# Patient Record
Sex: Female | Born: 1980 | Hispanic: No | Marital: Married | Smoking: Never smoker
Health system: Southern US, Community
[De-identification: ages and names within clinical notes are randomized; demographics above are authoritative.]

## PROBLEM LIST (undated history)

## (undated) ENCOUNTER — Inpatient Hospital Stay (HOSPITAL_COMMUNITY): Payer: Self-pay

## (undated) DIAGNOSIS — E119 Type 2 diabetes mellitus without complications: Secondary | ICD-10-CM

## (undated) HISTORY — DX: Type 2 diabetes mellitus without complications: E11.9

---

## 2014-11-25 LAB — OB RESULTS CONSOLE HGB/HCT, BLOOD
HCT: 38 %
HEMOGLOBIN: 12.9 g/dL

## 2014-11-25 LAB — OB RESULTS CONSOLE RUBELLA ANTIBODY, IGM: Rubella: IMMUNE

## 2014-11-25 LAB — OB RESULTS CONSOLE HIV ANTIBODY (ROUTINE TESTING): HIV: NONREACTIVE

## 2014-11-25 LAB — OB RESULTS CONSOLE HEPATITIS B SURFACE ANTIGEN: Hepatitis B Surface Ag: NEGATIVE

## 2014-11-25 LAB — OB RESULTS CONSOLE PLATELET COUNT: PLATELETS: 291 10*3/uL

## 2014-11-25 LAB — OB RESULTS CONSOLE ANTIBODY SCREEN: Antibody Screen: NEGATIVE

## 2014-11-25 LAB — OB RESULTS CONSOLE RPR: RPR: NONREACTIVE

## 2014-11-25 LAB — OB RESULTS CONSOLE VARICELLA ZOSTER ANTIBODY, IGG: VARICELLA IGG: IMMUNE

## 2014-11-25 LAB — OB RESULTS CONSOLE ABO/RH: RH Type: POSITIVE

## 2015-02-17 LAB — GLUCOSE TOLERANCE, 3 HOURS
Glucose, GTT - 1 Hour: 217 mg/dL — AB (ref ?–200)
Glucose, GTT - 2 Hour: 171 mg/dL — AB (ref ?–140)
Glucose, GTT - 3 Hour: 131 mg/dL (ref ?–140)
Glucose, GTT - Fasting: 88 mg/dL (ref 80–110)

## 2015-02-25 ENCOUNTER — Encounter: Payer: Self-pay | Admitting: *Deleted

## 2015-03-04 ENCOUNTER — Encounter: Payer: Self-pay | Admitting: *Deleted

## 2015-03-16 ENCOUNTER — Ambulatory Visit (INDEPENDENT_AMBULATORY_CARE_PROVIDER_SITE_OTHER): Payer: 59 | Admitting: Obstetrics and Gynecology

## 2015-03-16 ENCOUNTER — Encounter: Payer: Self-pay | Admitting: Obstetrics and Gynecology

## 2015-03-16 VITALS — BP 115/65 | HR 74 | Temp 98.1°F | Ht 62.5 in | Wt 145.7 lb

## 2015-03-16 DIAGNOSIS — O24419 Gestational diabetes mellitus in pregnancy, unspecified control: Secondary | ICD-10-CM

## 2015-03-16 DIAGNOSIS — O34219 Maternal care for unspecified type scar from previous cesarean delivery: Secondary | ICD-10-CM

## 2015-03-16 DIAGNOSIS — O0990 Supervision of high risk pregnancy, unspecified, unspecified trimester: Secondary | ICD-10-CM | POA: Insufficient documentation

## 2015-03-16 DIAGNOSIS — O0993 Supervision of high risk pregnancy, unspecified, third trimester: Secondary | ICD-10-CM

## 2015-03-16 DIAGNOSIS — O3421 Maternal care for scar from previous cesarean delivery: Secondary | ICD-10-CM

## 2015-03-16 LAB — POCT URINALYSIS DIP (DEVICE)
Bilirubin Urine: NEGATIVE
Glucose, UA: NEGATIVE mg/dL
KETONES UR: 15 mg/dL — AB
Leukocytes, UA: NEGATIVE
Nitrite: NEGATIVE
Protein, ur: NEGATIVE mg/dL
SPECIFIC GRAVITY, URINE: 1.01 (ref 1.005–1.030)
Urobilinogen, UA: 0.2 mg/dL (ref 0.0–1.0)
pH: 6 (ref 5.0–8.0)

## 2015-03-16 MED ORDER — ACCU-CHEK NANO SMARTVIEW W/DEVICE KIT: 1.0000 | PACK | Freq: Four times a day (QID) | Status: DC

## 2015-03-16 MED ORDER — GLUCOSE BLOOD VI STRP: ORAL_STRIP | Status: DC

## 2015-03-16 MED ORDER — FAMOTIDINE 20 MG PO TABS: 20.0000 mg | ORAL_TABLET | Freq: Two times a day (BID) | ORAL | Status: DC

## 2015-03-16 MED ORDER — ACCU-CHEK FASTCLIX LANCETS MISC: 1.0000 [IU] | Freq: Four times a day (QID) | Status: DC

## 2015-03-16 NOTE — Patient Instructions (Signed)
Third Trimester of Pregnancy The third trimester is from week 29 through week 42, months 7 through 9. The third trimester is a time when the fetus is growing rapidly. At the end of the ninth month, the fetus is about 20 inches in length and weighs 6-10 pounds.  BODY CHANGES Your body goes through many changes during pregnancy. The changes vary from woman to woman.   Your weight will continue to increase. You can expect to gain 25-35 pounds (11-16 kg) by the end of the pregnancy.  You may begin to get stretch marks on your hips, abdomen, and breasts.  You may urinate more often because the fetus is moving lower into your pelvis and pressing on your bladder.  You may develop or continue to have heartburn as a result of your pregnancy.  You may develop constipation because certain hormones are causing the muscles that push waste through your intestines to slow down.  You may develop hemorrhoids or swollen, bulging veins (varicose veins).  You may have pelvic pain because of the weight gain and pregnancy hormones relaxing your joints between the bones in your pelvis. Backaches may result from overexertion of the muscles supporting your posture.  You may have changes in your hair. These can include thickening of your hair, rapid growth, and changes in texture. Some women also have hair loss during or after pregnancy, or hair that feels dry or thin. Your hair will most likely return to normal after your baby is born.  Your breasts will continue to grow and be tender. A yellow discharge may leak from your breasts called colostrum.  Your belly button may stick out.  You may feel short of breath because of your expanding uterus.  You may notice the fetus "dropping," or moving lower in your abdomen.  You may have a bloody mucus discharge. This usually occurs a few days to a week before labor begins.  Your cervix becomes thin and soft (effaced) near your due date. WHAT TO EXPECT AT YOUR  PRENATAL EXAMS  You will have prenatal exams every 2 weeks until week 36. Then, you will have weekly prenatal exams. During a routine prenatal visit:  You will be weighed to make sure you and the fetus are growing normally.  Your blood pressure is taken.  Your abdomen will be measured to track your baby's growth.  The fetal heartbeat will be listened to.  Any test results from the previous visit will be discussed.  You may have a cervical check near your due date to see if you have effaced. At around 36 weeks, your caregiver will check your cervix. At the same time, your caregiver will also perform a test on the secretions of the vaginal tissue. This test is to determine if a type of bacteria, Group B streptococcus, is present. Your caregiver will explain this further. Your caregiver may ask you:  What your birth plan is.  How you are feeling.  If you are feeling the baby move.  If you have had any abnormal symptoms, such as leaking fluid, bleeding, severe headaches, or abdominal cramping.  If you have any questions. Other tests or screenings that may be performed during your third trimester include:  Blood tests that check for low iron levels (anemia).  Fetal testing to check the health, activity level, and growth of the fetus. Testing is done if you have certain medical conditions or if there are problems during the pregnancy. FALSE LABOR You may feel small, irregular contractions that   eventually go away. These are called Braxton Hicks contractions, or false labor. Contractions may last for hours, days, or even weeks before true labor sets in. If contractions come at regular intervals, intensify, or become painful, it is best to be seen by your caregiver.  SIGNS OF LABOR   Menstrual-like cramps.  Contractions that are 5 minutes apart or less.  Contractions that start on the top of the uterus and spread down to the lower abdomen and back.  A sense of increased pelvic  pressure or back pain.  A watery or bloody mucus discharge that comes from the vagina. If you have any of these signs before the 37th week of pregnancy, call your caregiver right away. You need to go to the hospital to get checked immediately. HOME CARE INSTRUCTIONS   Avoid all smoking, herbs, alcohol, and unprescribed drugs. These chemicals affect the formation and growth of the baby.  Follow your caregiver's instructions regarding medicine use. There are medicines that are either safe or unsafe to take during pregnancy.  Exercise only as directed by your caregiver. Experiencing uterine cramps is a good sign to stop exercising.  Continue to eat regular, healthy meals.  Wear a good support bra for breast tenderness.  Do not use hot tubs, steam rooms, or saunas.  Wear your seat belt at all times when driving.  Avoid raw meat, uncooked cheese, cat litter boxes, and soil used by cats. These carry germs that can cause birth defects in the baby.  Take your prenatal vitamins.  Try taking a stool softener (if your caregiver approves) if you develop constipation. Eat more high-fiber foods, such as fresh vegetables or fruit and whole grains. Drink plenty of fluids to keep your urine clear or pale yellow.  Take warm sitz baths to soothe any pain or discomfort caused by hemorrhoids. Use hemorrhoid cream if your caregiver approves.  If you develop varicose veins, wear support hose. Elevate your feet for 15 minutes, 3-4 times a day. Limit salt in your diet.  Avoid heavy lifting, wear low heal shoes, and practice good posture.  Rest a lot with your legs elevated if you have leg cramps or low back pain.  Visit your dentist if you have not gone during your pregnancy. Use a soft toothbrush to brush your teeth and be gentle when you floss.  A sexual relationship may be continued unless your caregiver directs you otherwise.  Do not travel far distances unless it is absolutely necessary and only  with the approval of your caregiver.  Take prenatal classes to understand, practice, and ask questions about the labor and delivery.  Make a trial run to the hospital.  Pack your hospital bag.  Prepare the baby's nursery.  Continue to go to all your prenatal visits as directed by your caregiver. SEEK MEDICAL CARE IF:  You are unsure if you are in labor or if your water has broken.  You have dizziness.  You have mild pelvic cramps, pelvic pressure, or nagging pain in your abdominal area.  You have persistent nausea, vomiting, or diarrhea.  You have a bad smelling vaginal discharge.  You have pain with urination. SEEK IMMEDIATE MEDICAL CARE IF:   You have a fever.  You are leaking fluid from your vagina.  You have spotting or bleeding from your vagina.  You have severe abdominal cramping or pain.  You have rapid weight loss or gain.  You have shortness of breath with chest pain.  You notice sudden or extreme swelling   of your face, hands, ankles, feet, or legs.  You have not felt your baby move in over an hour.  You have severe headaches that do not go away with medicine.  You have vision changes. Document Released: 08/30/2001 Document Revised: 09/10/2013 Document Reviewed: 11/06/2012 [employer] Patient Information 2015 [employer]. This information is not intended to replace advice given to you by your health care provider. Make sure you discuss any questions you have with your health care provider.  Contraception Choices Contraception (birth control) is the use of any methods or devices to prevent pregnancy. Below are some methods to help avoid pregnancy. HORMONAL METHODS   Contraceptive implant. This is a thin, plastic tube containing progesterone hormone. It does not contain estrogen hormone. Your health care provider inserts the tube in the inner part of the upper arm. The tube can remain in place for up to 3 years. After 3 years, the implant must be removed.  The implant prevents the ovaries from releasing an egg (ovulation), thickens the cervical mucus to prevent sperm from entering the uterus, and thins the lining of the inside of the uterus.  Progesterone-only injections. These injections are given every 3 months by your health care provider to prevent pregnancy. This synthetic progesterone hormone stops the ovaries from releasing eggs. It also thickens cervical mucus and changes the uterine lining. This makes it harder for sperm to survive in the uterus.  Birth control pills. These pills contain estrogen and progesterone hormone. They work by preventing the ovaries from releasing eggs (ovulation). They also cause the cervical mucus to thicken, preventing the sperm from entering the uterus. Birth control pills are prescribed by a health care provider.Birth control pills can also be used to treat heavy periods.  Minipill. This type of birth control pill contains only the progesterone hormone. They are taken every day of each month and must be prescribed by your health care provider.  Birth control patch. The patch contains hormones similar to those in birth control pills. It must be changed once a week and is prescribed by a health care provider.  Vaginal ring. The ring contains hormones similar to those in birth control pills. It is left in the vagina for 3 weeks, removed for 1 week, and then a new one is put back in place. The patient must be comfortable inserting and removing the ring from the vagina.A health care provider's prescription is necessary.  Emergency contraception. Emergency contraceptives prevent pregnancy after unprotected sexual intercourse. This pill can be taken right after sex or up to 5 days after unprotected sex. It is most effective the sooner you take the pills after having sexual intercourse. Most emergency contraceptive pills are available without a prescription. Check with your pharmacist. Do not use emergency contraception as  your only form of birth control. BARRIER METHODS   Female condom. This is a thin sheath (latex or rubber) that is worn over the penis during sexual intercourse. It can be used with spermicide to increase effectiveness.  Female condom. This is a soft, loose-fitting sheath that is put into the vagina before sexual intercourse.  Diaphragm. This is a soft, latex, dome-shaped barrier that must be fitted by a health care provider. It is inserted into the vagina, along with a spermicidal jelly. It is inserted before intercourse. The diaphragm should be left in the vagina for 6 to 8 hours after intercourse.  Cervical cap. This is a round, soft, latex or plastic cup that fits over the cervix and must be   fitted by a health care provider. The cap can be left in place for up to 48 hours after intercourse.  Sponge. This is a soft, circular piece of polyurethane foam. The sponge has spermicide in it. It is inserted into the vagina after wetting it and before sexual intercourse.  Spermicides. These are chemicals that kill or block sperm from entering the cervix and uterus. They come in the form of creams, jellies, suppositories, foam, or tablets. They do not require a prescription. They are inserted into the vagina with an applicator before having sexual intercourse. The process must be repeated every time you have sexual intercourse. INTRAUTERINE CONTRACEPTION  Intrauterine device (IUD). This is a T-shaped device that is put in a woman's uterus during a menstrual period to prevent pregnancy. There are 2 types:  Copper IUD. This type of IUD is wrapped in copper wire and is placed inside the uterus. Copper makes the uterus and fallopian tubes produce a fluid that kills sperm. It can stay in place for 10 years.  Hormone IUD. This type of IUD contains the hormone progestin (synthetic progesterone). The hormone thickens the cervical mucus and prevents sperm from entering the uterus, and it also thins the uterine  lining to prevent implantation of a fertilized egg. The hormone can weaken or kill the sperm that get into the uterus. It can stay in place for 3-5 years, depending on which type of IUD is used. PERMANENT METHODS OF CONTRACEPTION  Female tubal ligation. This is when the woman's fallopian tubes are surgically sealed, tied, or blocked to prevent the egg from traveling to the uterus.  Hysteroscopic sterilization. This involves placing a small coil or insert into each fallopian tube. Your doctor uses a technique called hysteroscopy to do the procedure. The device causes scar tissue to form. This results in permanent blockage of the fallopian tubes, so the sperm cannot fertilize the egg. It takes about 3 months after the procedure for the tubes to become blocked. You must use another form of birth control for these 3 months.  Female sterilization. This is when the female has the tubes that carry sperm tied off (vasectomy).This blocks sperm from entering the vagina during sexual intercourse. After the procedure, the man can still ejaculate fluid (semen). NATURAL PLANNING METHODS  Natural family planning. This is not having sexual intercourse or using a barrier method (condom, diaphragm, cervical cap) on days the woman could become pregnant.  Calendar method. This is keeping track of the length of each menstrual cycle and identifying when you are fertile.  Ovulation method. This is avoiding sexual intercourse during ovulation.  Symptothermal method. This is avoiding sexual intercourse during ovulation, using a thermometer and ovulation symptoms.  Post-ovulation method. This is timing sexual intercourse after you have ovulated. Regardless of which type or method of contraception you choose, it is important that you use condoms to protect against the transmission of sexually transmitted infections (STIs). Talk with your health care provider about which form of contraception is most appropriate for  you. Document Released: 09/05/2005 Document Revised: 09/10/2013 Document Reviewed: 02/28/2013 [employer] Patient Information 2015 [employer]. This information is not intended to replace advice given to you by your health care provider. Make sure you discuss any questions you have with your health care provider.  Breastfeeding Deciding to breastfeed is one of the best choices you can make for you and your baby. A change in hormones during pregnancy causes your breast tissue to grow and increases the number and size of   your milk ducts. These hormones also allow proteins, sugars, and fats from your blood supply to make breast milk in your milk-producing glands. Hormones prevent breast milk from being released before your baby is born as well as prompt milk flow after birth. Once breastfeeding has begun, thoughts of your baby, as well as his or her sucking or crying, can stimulate the release of milk from your milk-producing glands.  BENEFITS OF BREASTFEEDING For Your Baby  Your first milk (colostrum) helps your baby's digestive system function better.   There are antibodies in your milk that help your baby fight off infections.   Your baby has a lower incidence of asthma, allergies, and sudden infant death syndrome.   The nutrients in breast milk are better for your baby than infant formulas and are designed uniquely for your baby's needs.   Breast milk improves your baby's brain development.   Your baby is less likely to develop other conditions, such as childhood obesity, asthma, or type 2 diabetes mellitus.  For You   Breastfeeding helps to create a very special bond between you and your baby.   Breastfeeding is convenient. Breast milk is always available at the correct temperature and costs nothing.   Breastfeeding helps to burn calories and helps you lose the weight gained during pregnancy.   Breastfeeding makes your uterus contract to its prepregnancy size faster and slows  bleeding (lochia) after you give birth.   Breastfeeding helps to lower your risk of developing type 2 diabetes mellitus, osteoporosis, and breast or ovarian cancer later in life. SIGNS THAT YOUR BABY IS HUNGRY Early Signs of Hunger  Increased alertness or activity.  Stretching.  Movement of the head from side to side.  Movement of the head and opening of the mouth when the corner of the mouth or cheek is stroked (rooting).  Increased sucking sounds, smacking lips, cooing, sighing, or squeaking.  Hand-to-mouth movements.  Increased sucking of fingers or hands. Late Signs of Hunger  Fussing.  Intermittent crying. Extreme Signs of Hunger Signs of extreme hunger will require calming and consoling before your baby will be able to breastfeed successfully. Do not wait for the following signs of extreme hunger to occur before you initiate breastfeeding:   Restlessness.  A loud, strong cry.   Screaming. BREASTFEEDING BASICS Breastfeeding Initiation  Find a comfortable place to sit or lie down, with your neck and back well supported.  Place a pillow or rolled up blanket under your baby to bring him or her to the level of your breast (if you are seated). Nursing pillows are specially designed to help support your arms and your baby while you breastfeed.  Make sure that your baby's abdomen is facing your abdomen.   Gently massage your breast. With your fingertips, massage from your chest wall toward your nipple in a circular motion. This encourages milk flow. You may need to continue this action during the feeding if your milk flows slowly.  Support your breast with 4 fingers underneath and your thumb above your nipple. Make sure your fingers are well away from your nipple and your baby's mouth.   Stroke your baby's lips gently with your finger or nipple.   When your baby's mouth is open wide enough, quickly bring your baby to your breast, placing your entire nipple and as  much of the colored area around your nipple (areola) as possible into your baby's mouth.   More areola should be visible above your baby's upper lip than below   the lower lip.   Your baby's tongue should be between his or her lower gum and your breast.   Ensure that your baby's mouth is correctly positioned around your nipple (latched). Your baby's lips should create a seal on your breast and be turned out (everted).  It is common for your baby to suck about 2-3 minutes in order to start the flow of breast milk. Latching Teaching your baby how to latch on to your breast properly is very important. An improper latch can cause nipple pain and decreased milk supply for you and poor weight gain in your baby. Also, if your baby is not latched onto your nipple properly, he or she may swallow some air during feeding. This can make your baby fussy. Burping your baby when you switch breasts during the feeding can help to get rid of the air. However, teaching your baby to latch on properly is still the best way to prevent fussiness from swallowing air while breastfeeding. Signs that your baby has successfully latched on to your nipple:    Silent tugging or silent sucking, without causing you pain.   Swallowing heard between every 3-4 sucks.    Muscle movement above and in front of his or her ears while sucking.  Signs that your baby has not successfully latched on to nipple:   Sucking sounds or smacking sounds from your baby while breastfeeding.  Nipple pain. If you think your baby has not latched on correctly, slip your finger into the corner of your baby's mouth to break the suction and place it between your baby's gums. Attempt breastfeeding initiation again. Signs of Successful Breastfeeding Signs from your baby:   A gradual decrease in the number of sucks or complete cessation of sucking.   Falling asleep.   Relaxation of his or her body.   Retention of a small amount of milk in  his or her mouth.   Letting go of your breast by himself or herself. Signs from you:  Breasts that have increased in firmness, weight, and size 1-3 hours after feeding.   Breasts that are softer immediately after breastfeeding.  Increased milk volume, as well as a change in milk consistency and color by the fifth day of breastfeeding.   Nipples that are not sore, cracked, or bleeding. Signs That Your Baby is Getting Enough Milk  Wetting at least 3 diapers in a 24-hour period. The urine should be clear and pale yellow by age 5 days.  At least 3 stools in a 24-hour period by age 5 days. The stool should be soft and yellow.  At least 3 stools in a 24-hour period by age 7 days. The stool should be seedy and yellow.  No loss of weight greater than 10% of birth weight during the first 3 days of age.  Average weight gain of 4-7 ounces (113-198 g) per week after age 4 days.  Consistent daily weight gain by age 5 days, without weight loss after the age of 2 weeks. After a feeding, your baby may spit up a small amount. This is common. BREASTFEEDING FREQUENCY AND DURATION Frequent feeding will help you make more milk and can prevent sore nipples and breast engorgement. Breastfeed when you feel the need to reduce the fullness of your breasts or when your baby shows signs of hunger. This is called "breastfeeding on demand." Avoid introducing a pacifier to your baby while you are working to establish breastfeeding (the first 4-6 weeks after your baby is born).   After this time you may choose to use a pacifier. Research has shown that pacifier use during the first year of a baby's life decreases the risk of sudden infant death syndrome (SIDS). Allow your baby to feed on each breast as long as he or she wants. Breastfeed until your baby is finished feeding. When your baby unlatches or falls asleep while feeding from the first breast, offer the second breast. Because newborns are often sleepy in the  first few weeks of life, you may need to awaken your baby to get him or her to feed. Breastfeeding times will vary from baby to baby. However, the following rules can serve as a guide to help you ensure that your baby is properly fed:  Newborns (babies 4 weeks of age or younger) may breastfeed every 1-3 hours.  Newborns should not go longer than 3 hours during the day or 5 hours during the night without breastfeeding.  You should breastfeed your baby a minimum of 8 times in a 24-hour period until you begin to introduce solid foods to your baby at around 6 months of age. BREAST MILK PUMPING Pumping and storing breast milk allows you to ensure that your baby is exclusively fed your breast milk, even at times when you are unable to breastfeed. This is especially important if you are going back to work while you are still breastfeeding or when you are not able to be present during feedings. Your lactation consultant can give you guidelines on how long it is safe to store breast milk.  A breast pump is a machine that allows you to pump milk from your breast into a sterile bottle. The pumped breast milk can then be stored in a refrigerator or freezer. Some breast pumps are operated by hand, while others use electricity. Ask your lactation consultant which type will work best for you. Breast pumps can be purchased, but some hospitals and breastfeeding support groups lease breast pumps on a monthly basis. A lactation consultant can teach you how to hand express breast milk, if you prefer not to use a pump.  CARING FOR YOUR BREASTS WHILE YOU BREASTFEED Nipples can become dry, cracked, and sore while breastfeeding. The following recommendations can help keep your breasts moisturized and healthy:  Avoid using soap on your nipples.   Wear a supportive bra. Although not required, special nursing bras and tank tops are designed to allow access to your breasts for breastfeeding without taking off your entire bra  or top. Avoid wearing underwire-style bras or extremely tight bras.  Air dry your nipples for 3-4minutes after each feeding.   Use only cotton bra pads to absorb leaked breast milk. Leaking of breast milk between feedings is normal.   Use lanolin on your nipples after breastfeeding. Lanolin helps to maintain your skin's normal moisture barrier. If you use pure lanolin, you do not need to wash it off before feeding your baby again. Pure lanolin is not toxic to your baby. You may also hand express a few drops of breast milk and gently massage that milk into your nipples and allow the milk to air dry. In the first few weeks after giving birth, some women experience extremely full breasts (engorgement). Engorgement can make your breasts feel heavy, warm, and tender to the touch. Engorgement peaks within 3-5 days after you give birth. The following recommendations can help ease engorgement:  Completely empty your breasts while breastfeeding or pumping. You may want to start by applying warm, moist heat (in   the shower or with warm water-soaked hand towels) just before feeding or pumping. This increases circulation and helps the milk flow. If your baby does not completely empty your breasts while breastfeeding, pump any extra milk after he or she is finished.  Wear a snug bra (nursing or regular) or tank top for 1-2 days to signal your body to slightly decrease milk production.  Apply ice packs to your breasts, unless this is too uncomfortable for you.  Make sure that your baby is latched on and positioned properly while breastfeeding. If engorgement persists after 48 hours of following these recommendations, contact your health care provider or a lactation consultant. OVERALL HEALTH CARE RECOMMENDATIONS WHILE BREASTFEEDING  Eat healthy foods. Alternate between meals and snacks, eating 3 of each per day. Because what you eat affects your breast milk, some of the foods may make your baby more irritable  than usual. Avoid eating these foods if you are sure that they are negatively affecting your baby.  Drink milk, fruit juice, and water to satisfy your thirst (about 10 glasses a day).   Rest often, relax, and continue to take your prenatal vitamins to prevent fatigue, stress, and anemia.  Continue breast self-awareness checks.  Avoid chewing and smoking tobacco.  Avoid alcohol and drug use. Some medicines that may be harmful to your baby can pass through breast milk. It is important to ask your health care provider before taking any medicine, including all over-the-counter and prescription medicine as well as vitamin and herbal supplements. It is possible to become pregnant while breastfeeding. If birth control is desired, ask your health care provider about options that will be safe for your baby. SEEK MEDICAL CARE IF:   You feel like you want to stop breastfeeding or have become frustrated with breastfeeding.  You have painful breasts or nipples.  Your nipples are cracked or bleeding.  Your breasts are red, tender, or warm.  You have a swollen area on either breast.  You have a fever or chills.  You have nausea or vomiting.  You have drainage other than breast milk from your nipples.  Your breasts do not become full before feedings by the fifth day after you give birth.  You feel sad and depressed.  Your baby is too sleepy to eat well.  Your baby is having trouble sleeping.   Your baby is wetting less than 3 diapers in a 24-hour period.  Your baby has less than 3 stools in a 24-hour period.  Your baby's skin or the white part of his or her eyes becomes yellow.   Your baby is not gaining weight by 5 days of age. SEEK IMMEDIATE MEDICAL CARE IF:   Your baby is overly tired (lethargic) and does not want to wake up and feed.  Your baby develops an unexplained fever. Document Released: 09/05/2005 Document Revised: 09/10/2013 Document Reviewed: 02/27/2013 ExitCare  Patient Information 2015 [employer]. This information is not intended to replace advice given to you by your health care provider. Make sure you discuss any questions you have with your health care provider.  

## 2015-03-16 NOTE — Addendum Note (Signed)
Addended by: Louanna RawAMPBELL, Marisa Hufstetler M on: 03/16/2015 11:27 AM   Modules accepted: Orders

## 2015-03-16 NOTE — Progress Notes (Signed)
   Subjective:    Amber Black is a G2P1002 [redacted]w[redacted]d being seen today for her first obstetrical visit.  Her obstetrical history is significant for previous cesarean section secondary to fetal distress and currently GDM. Patient does intend to breast feed. Pregnancy history fully reviewed.  Patient reports no complaints.  Filed Vitals:   03/16/15 0904 03/16/15 0905  BP: 115/65   Pulse: 74   Temp: 98.1 F (36.7 C)   Height:  5' 1.5" (1.562 m)  Weight: 145 lb 11.2 oz (66.089 kg)     HISTORY: OB History  Gravida Para Term Preterm AB SAB TAB Ectopic Multiple Living  2 1 1       2     # Outcome Date GA Lbr Len/2nd Weight Sex Delivery Anes PTL Lv  2 Current           1 Term 10/10/13 [redacted]w[redacted]d   M CS-Unspec None N Y     Past Medical History  Diagnosis Date  . Diabetes mellitus without complication    Past Surgical History  Procedure Laterality Date  . Cesarean section     History reviewed. No pertinent family history.   Exam    Uterus:  Fundal Height: 32 cm      Assessment:    Pregnancy: [name] Patient Active Problem List   Diagnosis Date Noted  . Gestational diabetes mellitus, antepartum 03/16/2015  . Supervision of high risk pregnancy, antepartum 03/16/2015  . Previous cesarean delivery, antepartum 03/16/2015        Plan:     Initial labs drawn. Prenatal vitamins. Problem list reviewed and updated. Genetic Screening discussed : too late.  Ultrasound discussed; fetal survey: results reviewed. Will schedule appointment with diabetic educator and will meet with nutritionist today Discussed risks/benefits of TOLAC- Patient desires and consent signed today  Follow up in 2 weeks. 50% of 30 min visit spent on counseling and coordination of care.     Amber Black 03/16/2015

## 2015-03-16 NOTE — Progress Notes (Signed)
Transfer from [facility] for GDM.  C/o heartburn especially at night-- requests medication for it.  Riz Welton Flakes used as interpreter.

## 2015-03-16 NOTE — Progress Notes (Signed)
Nutrition note: 1st visit consult & GDM diet education Pt was recently diagnosed with GDM Pt has gained 1.7# @ 3052w6d, which is < expected. Pt reports eating 3-4x/d - bigger meals ~12pm & 9pm, yogurt ~8am and then because it is ramadan, pt has been eating ~3am with her family. Pt's husband reports that pt adds sugar to her milk and a rose syrup to her water, which is high in sugar. Pt is taking a PNV. Pt reports some nausea & heartburn at night. Pt reports no walking or physical activity. Pt received verbal & written education via an interpreter about GDM diet. Encouraged pt to DC adding sugar & rose syrup to her drinks. Encouraged ~30 mins of walking/d. Discussed importance/ benefits of BF. Discussed wt gain goals of 15-25# or 0.6#/wk. Pt agrees to follow GDM diet with 3 meals & 1-3 snacks/d with proper CHO/ protein combination. [place] & plans to try to BF. F/u in 2-4 wks Blondell RevealLaura Adriannah Steinkamp, MS, RD, LDN, [facility] CenterBCLC

## 2015-03-18 ENCOUNTER — Encounter: Payer: 59 | Attending: Obstetrics and Gynecology | Admitting: *Deleted

## 2015-03-18 ENCOUNTER — Encounter: Payer: Self-pay | Admitting: *Deleted

## 2015-03-18 VITALS — Ht 61.5 in | Wt 146.6 lb

## 2015-03-18 DIAGNOSIS — O24419 Gestational diabetes mellitus in pregnancy, unspecified control: Secondary | ICD-10-CM | POA: Diagnosis not present

## 2015-03-18 DIAGNOSIS — Z713 Dietary counseling and surveillance: Secondary | ICD-10-CM | POA: Insufficient documentation

## 2015-03-20 NOTE — Progress Notes (Signed)
  Patient was seen on 03/18/15 for Gestational Diabetes self-management for individual visit due to Language Barrier. Interpretor here: Reymundo Poll. The following learning objectives were met by the patient during this course:   States the definition of Gestational Diabetes  States why dietary management is important in controlling blood glucose  Describes the effects each nutrient has on blood glucose levels  Demonstrates ability to create a balanced meal plan  Demonstrates carbohydrate counting   States when to check blood glucose levels  Demonstrates proper blood glucose monitoring techniques  States the effect of stress and exercise on blood glucose levels  Blood glucose monitor given: NO, she already has a meter  Patient instructed to monitor glucose levels: FBS: 60 - <90 2 hour: <120  *Patient received handouts:  Nutrition Diabetes and Pregnancy (in [place], which husband states he can read)  Carbohydrate Counting List in Urdu  Patient will be seen for follow-up as needed.

## 2015-03-24 ENCOUNTER — Encounter: Payer: Self-pay | Admitting: *Deleted

## 2015-03-30 ENCOUNTER — Ambulatory Visit (INDEPENDENT_AMBULATORY_CARE_PROVIDER_SITE_OTHER): Payer: 59 | Admitting: Obstetrics and Gynecology

## 2015-03-30 VITALS — BP 105/48 | HR 70 | Temp 97.7°F | Wt 144.6 lb

## 2015-03-30 DIAGNOSIS — O0993 Supervision of high risk pregnancy, unspecified, third trimester: Secondary | ICD-10-CM | POA: Diagnosis not present

## 2015-03-30 DIAGNOSIS — O34219 Maternal care for unspecified type scar from previous cesarean delivery: Secondary | ICD-10-CM

## 2015-03-30 DIAGNOSIS — Z23 Encounter for immunization: Secondary | ICD-10-CM

## 2015-03-30 DIAGNOSIS — O3421 Maternal care for scar from previous cesarean delivery: Secondary | ICD-10-CM

## 2015-03-30 DIAGNOSIS — O24419 Gestational diabetes mellitus in pregnancy, unspecified control: Secondary | ICD-10-CM

## 2015-03-30 LAB — CBC
HEMATOCRIT: 32.3 % — AB (ref 36.0–46.0)
HEMOGLOBIN: 10.9 g/dL — AB (ref 12.0–15.0)
MCH: 28.7 pg (ref 26.0–34.0)
MCHC: 33.7 g/dL (ref 30.0–36.0)
MCV: 85 fL (ref 78.0–100.0)
MPV: 12 fL (ref 8.6–12.4)
Platelets: 242 10*3/uL (ref 150–400)
RBC: 3.8 MIL/uL — AB (ref 3.87–5.11)
RDW: 13.5 % (ref 11.5–15.5)
WBC: 5.8 10*3/uL (ref 4.0–10.5)

## 2015-03-30 LAB — RPR

## 2015-03-30 LAB — POCT URINALYSIS DIP (DEVICE)
Bilirubin Urine: NEGATIVE
Glucose, UA: NEGATIVE mg/dL
Hgb urine dipstick: NEGATIVE
KETONES UR: NEGATIVE mg/dL
Nitrite: NEGATIVE
Protein, ur: 30 mg/dL — AB
Specific Gravity, Urine: 1.015 (ref 1.005–1.030)
Urobilinogen, UA: 1 mg/dL (ref 0.0–1.0)
pH: 6.5 (ref 5.0–8.0)

## 2015-03-30 MED ORDER — TETANUS-DIPHTH-ACELL PERTUSSIS 5-2.5-18.5 LF-MCG/0.5 IM SUSP
0.5000 mL | Freq: Once | INTRAMUSCULAR | Status: AC
  Administered 2015-03-30: 0.5 mL via INTRAMUSCULAR

## 2015-03-30 MED ORDER — FAMOTIDINE 20 MG PO TABS: 20.0000 mg | ORAL_TABLET | Freq: Two times a day (BID) | ORAL | Status: DC

## 2015-03-30 NOTE — Progress Notes (Signed)
Subjective:  Amber Black is a 34 y.o. G2P1002 at 32110w6d being seen today for ongoing prenatal care.  Patient reports nausea and vomiting in the evening only.  Contractions: Not present.  Vag. Bleeding: None. Movement: Present. Denies leaking of fluid.   The following portions of the patient's history were reviewed and updated as appropriate: allergies, current medications, past family history, past medical history, past social history, past surgical history and problem list.   Objective:   Filed Vitals:   03/30/15 1010  BP: 105/48  Pulse: 70  Temp: 97.7 F (36.5 C)  Weight: 144 lb 9.6 oz (65.59 kg)    Fetal Status: Fetal Heart Rate (bpm): 126   Movement: Present     General:  Alert, oriented and cooperative. Patient is in no acute distress.  Skin: Skin is warm and dry. No rash noted.   Cardiovascular: Normal heart rate noted  Respiratory: Normal respiratory effort, no problems with respiration noted  Abdomen: Soft, gravid, appropriate for gestational age. Pain/Pressure: Absent     Vaginal: Vag. Bleeding: None.       Cervix: Not evaluated        Extremities: Normal range of motion.  Edema: None  Mental Status: Normal mood and affect. Normal behavior. Normal judgment and thought content.   Urinalysis: Urine Protein: 1+ Urine Glucose: Negative  Assessment and Plan:  Pregnancy: G2P1002 at 45110w6d  1. Gestational diabetes mellitus, antepartum CBGs majority within range but elevated fasting over the past 5 days associated with her recently episodes of emesis at bedtime. Will start antiacid. Advised to eat small meals. Advised not to go to bed on an empty stomach and to try to consume a snack at bedtime - CBC - RPR - HIV antibody (with reflex) - Tdap (BOOSTRIX) injection 0.5 mL; Inject 0.5 mLs into the muscle once.  2. Previous cesarean delivery, antepartum Desires TOLAC  3. Supervision of high risk pregnancy, antepartum, third trimester    Preterm labor symptoms and general  obstetric precautions including but not limited to vaginal bleeding, contractions, leaking of fluid and fetal movement were reviewed in detail with the patient.  Please refer to After Visit Summary for other counseling recommendations.   Return in about 2 weeks (around 04/13/2015).   Catalina AntiguaPeggy Adalynne Steffensmeier, MD

## 2015-03-30 NOTE — Progress Notes (Signed)
Amber Black used for interpreter

## 2015-03-30 NOTE — Addendum Note (Signed)
Addended by: Kathee DeltonHILLMAN, Trace Cederberg L on: 03/30/2015 04:55 PM   Modules accepted: Orders

## 2015-03-31 LAB — HIV ANTIBODY (ROUTINE TESTING W REFLEX): HIV 1&2 Ab, 4th Generation: NONREACTIVE

## 2015-04-08 ENCOUNTER — Encounter (HOSPITAL_COMMUNITY): Payer: Self-pay | Admitting: *Deleted

## 2015-04-08 ENCOUNTER — Inpatient Hospital Stay (HOSPITAL_COMMUNITY)
Admission: AD | Admit: 2015-04-08 | Discharge: 2015-04-09 | Disposition: A | Discharge status: Home / Self Care | Payer: 59 | Source: Ambulatory Visit | Attending: Obstetrics & Gynecology | Admitting: Obstetrics & Gynecology

## 2015-04-08 DIAGNOSIS — R109 Unspecified abdominal pain: Secondary | ICD-10-CM | POA: Diagnosis present

## 2015-04-08 DIAGNOSIS — Z8632 Personal history of gestational diabetes: Secondary | ICD-10-CM | POA: Diagnosis not present

## 2015-04-08 DIAGNOSIS — Z3A35 35 weeks gestation of pregnancy: Secondary | ICD-10-CM | POA: Insufficient documentation

## 2015-04-08 DIAGNOSIS — O219 Vomiting of pregnancy, unspecified: Secondary | ICD-10-CM

## 2015-04-08 DIAGNOSIS — O212 Late vomiting of pregnancy: Secondary | ICD-10-CM | POA: Insufficient documentation

## 2015-04-08 LAB — URINALYSIS, ROUTINE W REFLEX MICROSCOPIC
Bilirubin Urine: NEGATIVE
Glucose, UA: NEGATIVE mg/dL
Hgb urine dipstick: NEGATIVE
Ketones, ur: 40 mg/dL — AB
Leukocytes, UA: NEGATIVE
Nitrite: NEGATIVE
PH: 5.5 (ref 5.0–8.0)
Protein, ur: NEGATIVE mg/dL
Specific Gravity, Urine: <1.005 — ABNORMAL LOW (ref 1.005–1.030)
Urobilinogen, UA: 0.2 mg/dL (ref 0.0–1.0)

## 2015-04-08 LAB — GLUCOSE, CAPILLARY: Glucose-Capillary: 66 mg/dL (ref 65–99)

## 2015-04-08 MED ORDER — ONDANSETRON 8 MG PO TBDP: 8.0000 mg | ORAL_TABLET | Freq: Three times a day (TID) | ORAL | Status: DC | PRN

## 2015-04-08 MED ORDER — ONDANSETRON 8 MG PO TBDP
8.0000 mg | ORAL_TABLET | Freq: Once | ORAL | Status: AC
  Administered 2015-04-08: 8 mg via ORAL
  Filled 2015-04-08: qty 1

## 2015-04-08 NOTE — MAU Note (Signed)
Has not been eating well and throwing up the last couple of days. Not eaten or throwing up today

## 2015-04-08 NOTE — Discharge Instructions (Signed)
Morning Sickness °Morning sickness is when you feel sick to your stomach (nauseous) during pregnancy. You may feel sick to your stomach and throw up (vomit). You may feel sick in the morning, but you can feel this way any time of day. Some women feel very sick to their stomach and cannot stop throwing up (hyperemesis gravidarum). °HOME CARE °· Only take medicines as told by your doctor. °· Take multivitamins as told by your doctor. Taking multivitamins before getting pregnant can stop or lessen the harshness of morning sickness. °· Eat dry toast or unsalted crackers before getting out of bed. °· Eat 5 to 6 small meals a day. °· Eat dry and bland foods like rice and baked potatoes. °· Do not drink liquids with meals. Drink between meals. °· Do not eat greasy, fatty, or spicy foods. °· Have someone cook for you if the smell of food causes you to feel sick or throw up. °· If you feel sick to your stomach after taking prenatal vitamins, take them at night or with a snack. °· Eat protein when you need a snack (nuts, yogurt, cheese). °· Eat unsweetened gelatins for dessert. °· Wear a bracelet used for sea sickness (acupressure wristband). °· Go to a doctor that puts thin needles into certain body points (acupuncture) to improve how you feel. °· Do not smoke. °· Use a humidifier to keep the air in your house free of odors. °· Get lots of fresh air. °GET HELP IF: °· You need medicine to feel better. °· You feel dizzy or lightheaded. °· You are losing weight. °GET HELP RIGHT AWAY IF:  °· You feel very sick to your stomach and cannot stop throwing up. °· You pass out (faint). °MAKE SURE YOU: °· Understand these instructions. °· Will watch your condition. °· Will get help right away if you are not doing well or get worse. °Document Released: 10/13/2004 Document Revised: 09/10/2013 Document Reviewed: 02/20/2013 °ExitCare® Patient Information ©2015 [employer], [employer]. This information is not intended to replace advice given to you by  your health care provider. Make sure you discuss any questions you have with your health care provider. ° °

## 2015-04-08 NOTE — H&P (Deleted)
History     CSN: [id number]  Arrival date and time: 04/08/15 2052   None     Chief Complaint  Patient presents with  . Abdominal Pain  . Nausea   HPI: (Entire hx provided by Husband since wife cannot speak english, permission granted via Urdu interpreter) Ms. Abramovich is a 34 yo G2P1002 that is 70w1dthat presents today with mid abdominal pain/pain above the umbilicus that started yesterday night. The pain is worse when she lays flat and the pain is present regardless of when she eats. For the past 3 weeks-1 month she has had vomiting that does not occur on a daily basis nor does it follow a pattern that they notice. If she eats a small amount she does not vomit, but if she eats "enough" she vomits. There has been no recent changes in diet. No hemoptysis, diarrhea, constipation, hematuria, dysuria, fever, sweating, no sick contacts or recent travel. She does endorse increased thirst, less urination, and says her urine is very yellow and smells different. Her last meal was at 2:00pm today(04/08/2015) had a small amount of noodles and tolerated that without vomiting.  OB History    Gravida Para Term Preterm AB TAB SAB Ectopic Multiple Living   _0 Past Medical History  Diagnosis Date  . Diabetes mellitus without complication     Past Surgical History  Procedure Laterality Date  . Cesarean section      History reviewed. No pertinent family history.  History  Substance Use Topics  . Smoking status: Never Smoker   . Smokeless tobacco: Never Used  . Alcohol Use: No    Allergies: No Known Allergies  Prescriptions prior to admission  Medication Sig Dispense Refill Last Dose  . ACCU-CHEK FASTCLIX LANCETS MISC 1 Units by Does not apply route 4 (four) times daily. 102 each 3 Taking  . Blood Glucose Monitoring Suppl (ACCU-CHEK NANO SMARTVIEW) W/DEVICE KIT 1 Device by Does not apply route 4 (four) times daily. 1 kit 0 Taking  . famotidine (PEPCID) 20 MG tablet Take 1  tablet (20 mg total) by mouth 2 (two) times daily. 60 tablet 3   . glucose blood (ACCU-CHEK SMARTVIEW) test strip Check CBG 4 times daily 51 each 12 Taking  . prenatal vitamin w/FE, FA (PRENATAL 1 + 1) 27-1 MG TABS tablet Take 1 tablet by mouth daily at 12 noon.   Taking    Review of Systems  Constitutional: Negative for fever and diaphoresis.  Respiratory: Negative for hemoptysis and shortness of breath.   Gastrointestinal: Positive for nausea, vomiting and abdominal pain. Negative for diarrhea and constipation.  Genitourinary: Negative for dysuria and hematuria.       Decreased urinary frequency  Neurological:       Endorses tingling in left foot.  Endo/Heme/Allergies:       Decreased urination   Physical Exam   Blood pressure 106/64, pulse 79, temperature 97.9 F (36.6 C), temperature source Oral, resp. rate 18, last menstrual period 08/05/2014, SpO2 100 %.  Physical Exam  Constitutional: She is oriented to person, place, and time. She appears well-developed and well-nourished.  In some distress.  HENT:  Head: Normocephalic and atraumatic.  Eyes: EOM are normal.  Cardiovascular: Normal rate, regular rhythm and normal heart sounds.   Respiratory: Effort normal and breath sounds normal.  GI: There is no tenderness.  Musculoskeletal: She exhibits no edema.  Neurological: She is alert and oriented  to person, place, and time.  Skin: Skin is warm and dry.  Psychiatric: She has a normal mood and affect. Her behavior is normal. Judgment and thought content normal.    MAU Course  Procedures Fingerstick gluose: 66 MDM   Assessment and Plan  A: Ms. Das is a 34 year old G2P1002 that is 67w1dwith a history of Class A1 diabetes that is  presenting with nausea, vomiting, and abdominal pain that is more consistent with nausea/vomiting/abd pain associated with pregnancy. It is unlikely a gastroenteritis given the lack of changes in bowel movements. Gastroparesis of diabetes is unlikely  as well given duration of her diabetes diagnosis which is diet controlled(A1). Her mucous membranes were mildly dry and BP is 106/64 indicating some mild dehydration.  P: For her nausea and vomiting, Zofran can be given along with a laxative to prevent constipation from Zofran administration. If she can tolerate fluids with Zofran, I recommend hydration via oral intake of fluids. For food, I'd advise small, frequent meals that should be relatively bland in nature. Avoid spicy, fatty or strong odorous foods. Lastly a fingerstick glucose is warranted to make sure she isn't hypoglycemic and urinalysis to rule out ketosis, given her history and recent onset of food-related vomiting.  SRuthann CancerJr. 04/08/2015, 11:21 PM

## 2015-04-09 DIAGNOSIS — O219 Vomiting of pregnancy, unspecified: Secondary | ICD-10-CM | POA: Diagnosis not present

## 2015-04-09 NOTE — MAU Provider Note (Signed)
History     CSN: [id number]  Arrival date and time: 04/08/15 2052  None    Chief Complaint  Patient presents with  . Abdominal Pain  . Nausea   HPI: (Entire hx provided by Husband since wife cannot speak english, permission granted via Urdu interpreter) Ms. Pelto is a 34 yo G2P1002 that is 64w1dthat presents today with mid abdominal pain/pain above the umbilicus that started yesterday night. The pain is worse when she lays flat and the pain is present regardless of when she eats. For the past 3 weeks-1 month she has had vomiting that does not occur on a daily basis nor does it follow a pattern that they notice. If she eats a small amount she does not vomit, but if she eats "enough" she vomits. There has been no recent changes in diet. No hemoptysis, diarrhea, constipation, hematuria, dysuria, fever, sweating, no sick contacts or recent travel. She does endorse increased thirst, less urination, and says her urine is very yellow and smells different. Her last meal was at 2:00pm today(04/08/2015) had a small amount of noodles and tolerated that without vomiting.  OB History    Gravida Para Term Preterm AB TAB SAB Ectopic Multiple Living   _0 Past Medical History  Diagnosis Date  . Diabetes mellitus without complication     Past Surgical History  Procedure Laterality Date  . Cesarean section      History reviewed. No pertinent family history.  History  Substance Use Topics  . Smoking status: Never Smoker   . Smokeless tobacco: Never Used  . Alcohol Use: No    Allergies: No Known Allergies  Prescriptions prior to admission  Medication Sig Dispense Refill Last Dose  . ACCU-CHEK FASTCLIX LANCETS MISC 1 Units by Does not apply route 4 (four) times daily. 102 each 3 Taking  . Blood Glucose Monitoring Suppl (ACCU-CHEK NANO SMARTVIEW) W/DEVICE KIT 1 Device by Does not apply route 4  (four) times daily. 1 kit 0 Taking  . famotidine (PEPCID) 20 MG tablet Take 1 tablet (20 mg total) by mouth 2 (two) times daily. 60 tablet 3   . glucose blood (ACCU-CHEK SMARTVIEW) test strip Check CBG 4 times daily 51 each 12 Taking  . prenatal vitamin w/FE, FA (PRENATAL 1 + 1) 27-1 MG TABS tablet Take 1 tablet by mouth daily at 12 noon.   Taking    Review of Systems  Constitutional: Negative for fever and diaphoresis.  Respiratory: Negative for hemoptysis and shortness of breath.  Gastrointestinal: Positive for nausea, vomiting and abdominal pain. Negative for diarrhea and constipation.  Genitourinary: Negative for dysuria and hematuria.   Decreased urinary frequency  Neurological:   Endorses tingling in left foot.  Endo/Heme/Allergies:   Decreased urination   Physical Exam   Blood pressure 106/64, pulse 79, temperature 97.9 F (36.6 C), temperature source Oral, resp. rate 18, last menstrual period 08/05/2014, SpO2 100 %.  Physical Exam  Constitutional: She is oriented to person, place, and time. She appears well-developed and well-nourished.  In some distress.  HENT:  Head: Normocephalic and atraumatic.  Eyes: EOM are normal.  Cardiovascular: Normal rate, regular rhythm and normal heart sounds.  Respiratory: Effort normal and breath sounds normal.  GI: There is no tenderness.  Musculoskeletal: She exhibits no edema.  Neurological: She is alert and oriented to person, place, and time.  Skin: Skin is warm and dry.  Psychiatric: She has  a normal mood and affect. Her behavior is normal. Judgment and thought content normal.    MAU Course  Procedures Fingerstick gluose: 66 MDM   Assessment and Plan  A: Ms. Voorhies is a 34 year old G2P1002 that is 22w1dwith a history of Class A1 diabetes that is presenting with nausea, vomiting, and abdominal pain that is more consistent with nausea/vomiting/abd pain associated with pregnancy. It  is unlikely a gastroenteritis given the lack of changes in bowel movements. Gastroparesis of diabetes is unlikely as well given duration of her diabetes diagnosis which is diet controlled(A1). Her mucous membranes were mildly dry and BP is 106/64 indicating some mild dehydration.  P: For her nausea and vomiting, Zofran can be given along with a laxative to prevent constipation from Zofran administration. If she can tolerate fluids with Zofran, I recommend hydration via oral intake of fluids. For food, I'd advise small, frequent meals that should be relatively bland in nature. Avoid spicy, fatty or strong odorous foods. Lastly a fingerstick glucose is warranted to make sure she isn't hypoglycemic and urinalysis to rule out ketosis, given her history and recent onset of food-related vomiting.  SRuthann CancerJr. 04/08/2015, 11:21 PM      CNM attestation:   ARonee Ranganathanis a 34y.o. G2P1002 reporting abd discomfort and occ vomiting +FM, denies LOF, VB, contractions, vaginal discharge.  PE: BP 93/77 mmHg  Pulse 74  Temp(Src) 97.9 F (36.6 C) (Oral)  Resp 18  SpO2 99%  LMP 08/05/2014 (Exact Date) Gen: calm comfortable, NAD Resp: normal effort, no distress Abd: gravid Cx: C/L  ROS, labs, PMH reviewed NST reactive 135-145, +accels, no decels Intermittent ctx on toco, no pattern  Urinalysis    Component Value Date/Time   COLORURINE YELLOW 04/08/2015 2125   APPEARANCEUR CLEAR 04/08/2015 2125   LABSPEC <1.005* 04/08/2015 2125   PHURINE 5.5 04/08/2015 2125   GLUCOSEU NEGATIVE 04/08/2015 2125   HGBUR NEGATIVE 04/08/2015 2125   [place] 04/08/2015 2125   KETONESUR 40* 04/08/2015 2125   PROTEINUR NEGATIVE 04/08/2015 2125   UROBILINOGEN 0.2 04/08/2015 2125   NITRITE NEGATIVE 04/08/2015 2125   LEUKOCYTESUR NEGATIVE 04/08/2015 2125   MDM:  - NST read - UA ordered - Cx exam  Plan: - pre labor precautions rev'd - Rx Zofran ODTprn - Diet as tol; increase fluid as  possible - continue routine follow up in OB clinic  Cleatis Fandrich, CNM 9:58 AM

## 2015-04-11 ENCOUNTER — Encounter (HOSPITAL_COMMUNITY): Payer: Self-pay | Admitting: *Deleted

## 2015-04-11 ENCOUNTER — Inpatient Hospital Stay (HOSPITAL_COMMUNITY)
Admission: AD | Admit: 2015-04-11 | Discharge: 2015-04-11 | Disposition: A | Discharge status: Home / Self Care | Payer: 59 | Source: Ambulatory Visit | Attending: Obstetrics & Gynecology | Admitting: Obstetrics & Gynecology

## 2015-04-11 DIAGNOSIS — O212 Late vomiting of pregnancy: Secondary | ICD-10-CM | POA: Diagnosis not present

## 2015-04-11 DIAGNOSIS — K219 Gastro-esophageal reflux disease without esophagitis: Secondary | ICD-10-CM | POA: Diagnosis not present

## 2015-04-11 DIAGNOSIS — O218 Other vomiting complicating pregnancy: Secondary | ICD-10-CM

## 2015-04-11 DIAGNOSIS — Z3A36 36 weeks gestation of pregnancy: Secondary | ICD-10-CM | POA: Diagnosis not present

## 2015-04-11 DIAGNOSIS — Z3A35 35 weeks gestation of pregnancy: Secondary | ICD-10-CM | POA: Diagnosis not present

## 2015-04-11 DIAGNOSIS — O99613 Diseases of the digestive system complicating pregnancy, third trimester: Secondary | ICD-10-CM

## 2015-04-11 DIAGNOSIS — O24419 Gestational diabetes mellitus in pregnancy, unspecified control: Secondary | ICD-10-CM | POA: Diagnosis not present

## 2015-04-11 LAB — URINALYSIS, ROUTINE W REFLEX MICROSCOPIC
BILIRUBIN URINE: NEGATIVE
GLUCOSE, UA: NEGATIVE mg/dL
Ketones, ur: 15 mg/dL — AB
Leukocytes, UA: NEGATIVE
Nitrite: NEGATIVE
PH: 6.5 (ref 5.0–8.0)
Protein, ur: NEGATIVE mg/dL
SPECIFIC GRAVITY, URINE: 1.01 (ref 1.005–1.030)
UROBILINOGEN UA: 0.2 mg/dL (ref 0.0–1.0)

## 2015-04-11 LAB — URINE MICROSCOPIC-ADD ON

## 2015-04-11 MED ORDER — PROMETHAZINE HCL 25 MG/ML IJ SOLN
12.5000 mg | Freq: Once | INTRAMUSCULAR | Status: AC
  Administered 2015-04-11: 12.5 mg via INTRAMUSCULAR
  Filled 2015-04-11: qty 1

## 2015-04-11 MED ORDER — GI COCKTAIL ~~LOC~~
30.0000 mL | Freq: Once | ORAL | Status: AC
  Administered 2015-04-11: 30 mL via ORAL
  Filled 2015-04-11: qty 30

## 2015-04-11 MED ORDER — PROMETHAZINE HCL 25 MG PO TABS
25.0000 mg | ORAL_TABLET | ORAL | Status: DC | PRN
Start: 2015-04-11 — End: 2015-04-27

## 2015-04-11 MED ORDER — OMEPRAZOLE 20 MG PO CPDR: 20.0000 mg | DELAYED_RELEASE_CAPSULE | Freq: Every day | ORAL | Status: DC

## 2015-04-11 NOTE — MAU Provider Note (Signed)
  History   G2P1001 @ 35.4 wks in with nauseas vomiting and epigastric pain. Zofran not working per pt.  CSN: [id number]  Arrival date and time: 04/11/15 1036   None     Chief Complaint  Patient presents with  . Emesis   HPI  OB History    Gravida Para Term Preterm AB TAB SAB Ectopic Multiple Living   _0 Past Medical History  Diagnosis Date  . Diabetes mellitus without complication     Past Surgical History  Procedure Laterality Date  . Cesarean section      History reviewed. No pertinent family history.  History  Substance Use Topics  . Smoking status: Never Smoker   . Smokeless tobacco: Never Used  . Alcohol Use: No    Allergies: No Known Allergies  Prescriptions prior to admission  Medication Sig Dispense Refill Last Dose  . ondansetron (ZOFRAN ODT) 8 MG disintegrating tablet Take 1 tablet (8 mg total) by mouth every 8 (eight) hours as needed for nausea or vomiting. 10 tablet 0 04/10/2015 at Unknown time  . prenatal vitamin w/FE, FA (PRENATAL 1 + 1) 27-1 MG TABS tablet Take 1 tablet by mouth daily at 12 noon.   Past Week at Unknown time  . ACCU-CHEK FASTCLIX LANCETS MISC 1 Units by Does not apply route 4 (four) times daily. 102 each 3 prn  . Blood Glucose Monitoring Suppl (ACCU-CHEK NANO SMARTVIEW) W/DEVICE KIT 1 Device by Does not apply route 4 (four) times daily. 1 kit 0 prn  . famotidine (PEPCID) 20 MG tablet Take 1 tablet (20 mg total) by mouth 2 (two) times daily. 60 tablet 3 prn  . glucose blood (ACCU-CHEK SMARTVIEW) test strip Check CBG 4 times daily 51 each 12 prn    Review of Systems  Constitutional: Negative.   HENT: Negative.   Eyes: Negative.   Respiratory: Negative.   Cardiovascular: Negative.   Gastrointestinal: Positive for nausea, vomiting and abdominal pain.  Genitourinary: Negative.   Musculoskeletal: Negative.   Skin: Negative.   Neurological: Negative.   Endo/Heme/Allergies: Negative.   Psychiatric/Behavioral:  Negative.    Physical Exam   Blood pressure 101/59, pulse 81, temperature 98.1 F (36.7 C), temperature source Oral, resp. rate 18, height _1  (1.575 m), weight 140 lb (63.504 kg), last menstrual period 08/05/2014, not currently breastfeeding.  Physical Exam  Constitutional: She is oriented to person, place, and time. She appears well-developed and well-nourished.  HENT:  Head: Normocephalic.  Eyes: Pupils are equal, round, and reactive to light.  Neck: Normal range of motion.  Cardiovascular: Normal rate, regular rhythm, normal heart sounds and intact distal pulses.   Respiratory: Effort normal and breath sounds normal.  GI: Soft. Bowel sounds are normal.  Genitourinary: Vagina normal and uterus normal.  Musculoskeletal: Normal range of motion.  Neurological: She is alert and oriented to person, place, and time. She has normal reflexes.  Skin: Skin is warm and dry.  Psychiatric: She has a normal mood and affect. Her behavior is normal. Judgment and thought content normal.    MAU Course  Procedures  MDM Nauseas, vomiting and GERD  Assessment and Plan  Gi cocktail and phenergan  LAWSON, MARIE DARLENE 04/11/2015, 12:11 PM

## 2015-04-11 NOTE — MAU Note (Signed)
Pt's spouse states here for increased vomiting. Hx intermittent vomiting for "a long time". No bleeding or abnormal discharge.

## 2015-04-11 NOTE — Discharge Instructions (Signed)
Gastroesophageal Reflux Disease, Adult Gastroesophageal reflux disease (GERD) happens when acid from your stomach flows up into the esophagus. When acid comes in contact with the esophagus, the acid causes soreness (inflammation) in the esophagus. Over time, GERD may create small holes (ulcers) in the lining of the esophagus. CAUSES   Increased body weight. This puts pressure on the stomach, making acid rise from the stomach into the esophagus.  Smoking. This increases acid production in the stomach.  Drinking alcohol. This causes decreased pressure in the lower esophageal sphincter (valve or ring of muscle between the esophagus and stomach), allowing acid from the stomach into the esophagus.  Late evening meals and a full stomach. This increases pressure and acid production in the stomach.  A malformed lower esophageal sphincter. Sometimes, no cause is found. SYMPTOMS   Burning pain in the lower part of the mid-chest behind the breastbone and in the mid-stomach area. This may occur twice a week or more often.  Trouble swallowing.  Sore throat.  Dry cough.  Asthma-like symptoms including chest tightness, shortness of breath, or wheezing. DIAGNOSIS  Your caregiver may be able to diagnose GERD based on your symptoms. In some cases, X-rays and other tests may be done to check for complications or to check the condition of your stomach and esophagus. TREATMENT  Your caregiver may recommend over-the-counter or prescription medicines to help decrease acid production. Ask your caregiver before starting or adding any new medicines.  HOME CARE INSTRUCTIONS   Change the factors that you can control. Ask your caregiver for guidance concerning weight loss, quitting smoking, and alcohol consumption.  Avoid foods and drinks that make your symptoms worse, such as:  Caffeine or alcoholic drinks.  Chocolate.  Peppermint or mint flavorings.  Garlic and onions.  Spicy foods.  Citrus fruits,  such as oranges, lemons, or limes.  Tomato-based foods such as sauce, chili, salsa, and pizza.  Fried and fatty foods.  Avoid lying down for the 3 hours prior to your bedtime or prior to taking a nap.  Eat small, frequent meals instead of large meals.  Wear loose-fitting clothing. Do not wear anything tight around your waist that causes pressure on your stomach.  Raise the head of your bed 6 to 8 inches with wood blocks to help you sleep. Extra pillows will not help.  Only take over-the-counter or prescription medicines for pain, discomfort, or fever as directed by your caregiver.  Do not take aspirin, ibuprofen, or other nonsteroidal anti-inflammatory drugs (NSAIDs). SEEK IMMEDIATE MEDICAL CARE IF:   You have pain in your arms, neck, jaw, teeth, or back.  Your pain increases or changes in intensity or duration.  You develop nausea, vomiting, or sweating (diaphoresis).  You develop shortness of breath, or you faint.  Your vomit is green, yellow, black, or looks like coffee grounds or blood.  Your stool is red, bloody, or black. These symptoms could be signs of other problems, such as heart disease, gastric bleeding, or esophageal bleeding. MAKE SURE YOU:   Understand these instructions.  Will watch your condition.  Will get help right away if you are not doing well or get worse. Document Released: 06/15/2005 Document Revised: 11/28/2011 Document Reviewed: 03/25/2011 Exit[employer] Patient Information 2015 [employer]. This information is not intended to replace advice given to you by your health care provider. Make sure you discuss any questions you have with your health care provider.  

## 2015-04-13 ENCOUNTER — Ambulatory Visit (INDEPENDENT_AMBULATORY_CARE_PROVIDER_SITE_OTHER): Payer: 59 | Admitting: Obstetrics & Gynecology

## 2015-04-13 VITALS — BP 97/66 | HR 75 | Temp 98.2°F | Wt 141.5 lb

## 2015-04-13 DIAGNOSIS — O2610 Low weight gain in pregnancy, unspecified trimester: Secondary | ICD-10-CM | POA: Insufficient documentation

## 2015-04-13 DIAGNOSIS — O24419 Gestational diabetes mellitus in pregnancy, unspecified control: Secondary | ICD-10-CM

## 2015-04-13 DIAGNOSIS — O0993 Supervision of high risk pregnancy, unspecified, third trimester: Secondary | ICD-10-CM

## 2015-04-13 DIAGNOSIS — O2613 Low weight gain in pregnancy, third trimester: Secondary | ICD-10-CM

## 2015-04-13 LAB — OB RESULTS CONSOLE GBS: GBS: POSITIVE

## 2015-04-13 MED ORDER — PROMETHAZINE HCL 25 MG PO TABS: 25.0000 mg | ORAL_TABLET | Freq: Four times a day (QID) | ORAL | Status: DC | PRN

## 2015-04-13 NOTE — Progress Notes (Signed)
Amber Black used for interpreter today

## 2015-04-13 NOTE — Progress Notes (Signed)
Subjective:  Amber Black is a 34 y.o. G2P1002 at [redacted]w[redacted]d being seen today for ongoing prenatal care.  Patient reports nausea.  Contractions: Irritability.  Vag. Bleeding: None. Movement: Present. Denies leaking of fluid.   The following portions of the patient's history were reviewed and updated as appropriate: allergies, current medications, past family history, past medical history, past social history, past surgical history and problem list.   Objective:   Filed Vitals:   04/13/15 1118  BP: 97/66  Pulse: 75  Temp: 98.2 F (36.8 C)  Weight: 141 lb 8 oz (64.184 kg)    Fetal Status: Fetal Heart Rate (bpm): 135 Fundal Height: 32 cm Movement: Present     General:  Alert, oriented and cooperative. Patient is in no acute distress.  Skin: Skin is warm and dry. No rash noted.   Cardiovascular: Normal heart rate noted  Respiratory: Normal respiratory effort, no problems with respiration noted  Abdomen: Soft, gravid, appropriate for gestational age. Pain/Pressure: Absent     Vaginal: Vag. Bleeding: None.       Cervix: Closed/thick/ballot.  Extremities: Normal range of motion.  Edema: None  Mental Status: Normal mood and affect. Normal behavior. Normal judgment and thought content.   Urinalysis:      Assessment and Plan:  Pregnancy: G2P1002 at [redacted]w[redacted]d  1. Gestational diabetes mellitus, antepartum - 2 elevated cbgs--continue diet, doing well. - Culture, beta strep (group b only) - GC/Chlamydia Probe Amp  2. Supervision of high risk pregnancy, antepartum, third trimester -S<D--get [place] for growth -for VBAC -Still needs to collect urine today. -phenergan for nausea; if continues to lose weight will add BOOST and refer to nutrition.   Preterm labor symptoms and general obstetric precautions including but not limited to vaginal bleeding, contractions, leaking of fluid and fetal movement were reviewed in detail with the patient. Please refer to After Visit Summary for other counseling  recommendations.  Return in about 1 week (around 04/20/2015).   Lesly Dukes, MD

## 2015-04-13 NOTE — Progress Notes (Signed)
Ultrasound scheduled for 04/24/2015 @ 10:30AM

## 2015-04-15 LAB — CULTURE, BETA STREP (GROUP B ONLY)

## 2015-04-20 ENCOUNTER — Ambulatory Visit (INDEPENDENT_AMBULATORY_CARE_PROVIDER_SITE_OTHER): Payer: 59 | Admitting: Obstetrics & Gynecology

## 2015-04-20 ENCOUNTER — Encounter: Payer: Self-pay | Admitting: Obstetrics & Gynecology

## 2015-04-20 VITALS — BP 114/58 | HR 58 | Temp 97.5°F | Wt 142.3 lb

## 2015-04-20 DIAGNOSIS — O24419 Gestational diabetes mellitus in pregnancy, unspecified control: Secondary | ICD-10-CM

## 2015-04-20 DIAGNOSIS — Z22330 Carrier of Group B streptococcus: Secondary | ICD-10-CM

## 2015-04-20 DIAGNOSIS — O99820 Streptococcus B carrier state complicating pregnancy: Secondary | ICD-10-CM

## 2015-04-20 DIAGNOSIS — O3421 Maternal care for scar from previous cesarean delivery: Secondary | ICD-10-CM

## 2015-04-20 DIAGNOSIS — O34219 Maternal care for unspecified type scar from previous cesarean delivery: Secondary | ICD-10-CM

## 2015-04-20 DIAGNOSIS — O0993 Supervision of high risk pregnancy, unspecified, third trimester: Secondary | ICD-10-CM

## 2015-04-20 LAB — POCT URINALYSIS DIP (DEVICE)
Bilirubin Urine: NEGATIVE
Glucose, UA: NEGATIVE mg/dL
HGB URINE DIPSTICK: NEGATIVE
KETONES UR: NEGATIVE mg/dL
LEUKOCYTES UA: NEGATIVE
Nitrite: NEGATIVE
PH: 6 (ref 5.0–8.0)
PROTEIN: NEGATIVE mg/dL
SPECIFIC GRAVITY, URINE: 1.01 (ref 1.005–1.030)
Urobilinogen, UA: 0.2 mg/dL (ref 0.0–1.0)

## 2015-04-20 NOTE — Progress Notes (Signed)
Subjective:  Amber Black is a 34 y.o. G2P1002 at [redacted]w[redacted]d being seen today for ongoing prenatal care.  Patient reports no complaints.   .   .  . Denies leaking of fluid.   The following portions of the patient's history were reviewed and updated as appropriate: allergies, current medications, past family history, past medical history, past social history, past surgical history and problem list.   Objective:   Filed Vitals:   04/20/15 1126  BP: 114/58  Pulse: 58  Temp: 97.5 F (36.4 C)  Weight: 142 lb 4.8 oz (64.547 kg)    Fetal Status: Fetal Heart Rate (bpm): 120         General:  Alert, oriented and cooperative. Patient is in no acute distress.  Skin: Skin is warm and dry. No rash noted.   Cardiovascular: Normal heart rate noted  Respiratory: Normal respiratory effort, no problems with respiration noted  Abdomen: Soft, gravid, appropriate for gestational age.       Vaginal:  .       Cervix: Not evaluated        Extremities: Normal range of motion.     Mental Status: Normal mood and affect. Normal behavior. Normal judgment and thought content.   Urinalysis:      Assessment and Plan:  Pregnancy: G2P1002 at [redacted]w[redacted]d  1. Previous cesarean delivery, antepartum  2. Gestational diabetes mellitus, antepartum She did not bring her sugars but when asked, the numbers that she reports are within range  3. Supervision of high risk pregnancy, antepartum, third trimester Her growth u/s is this Friday.  4. Group B Streptococcus carrier, +RV culture, currently pregnant She will need antibiotics in labor. I discussed this with her.  Preterm labor symptoms and general obstetric precautions including but not limited to vaginal bleeding, contractions, leaking of fluid and fetal movement were reviewed in detail with the patient. Please refer to After Visit Summary for other counseling recommendations.  No Follow-up on file.   Allie Bossier, MD

## 2015-04-24 ENCOUNTER — Other Ambulatory Visit: Payer: Self-pay | Admitting: Obstetrics & Gynecology

## 2015-04-24 ENCOUNTER — Ambulatory Visit (HOSPITAL_COMMUNITY)
Admission: RE | Admit: 2015-04-24 | Discharge: 2015-04-24 | Disposition: A | Discharge status: Home / Self Care | Payer: 59 | Source: Ambulatory Visit | Attending: Obstetrics & Gynecology | Admitting: Obstetrics & Gynecology

## 2015-04-24 DIAGNOSIS — O24419 Gestational diabetes mellitus in pregnancy, unspecified control: Secondary | ICD-10-CM

## 2015-04-24 DIAGNOSIS — O365990 Maternal care for other known or suspected poor fetal growth, unspecified trimester, not applicable or unspecified: Secondary | ICD-10-CM | POA: Insufficient documentation

## 2015-04-27 ENCOUNTER — Encounter: Payer: Self-pay | Admitting: Family Medicine

## 2015-04-27 ENCOUNTER — Ambulatory Visit (INDEPENDENT_AMBULATORY_CARE_PROVIDER_SITE_OTHER): Payer: 59 | Admitting: Family Medicine

## 2015-04-27 ENCOUNTER — Encounter: Payer: Self-pay | Admitting: *Deleted

## 2015-04-27 VITALS — BP 108/67 | HR 72 | Wt 141.8 lb

## 2015-04-27 DIAGNOSIS — O0993 Supervision of high risk pregnancy, unspecified, third trimester: Secondary | ICD-10-CM | POA: Diagnosis not present

## 2015-04-27 DIAGNOSIS — O321XX0 Maternal care for breech presentation, not applicable or unspecified: Secondary | ICD-10-CM | POA: Insufficient documentation

## 2015-04-27 DIAGNOSIS — O3421 Maternal care for scar from previous cesarean delivery: Secondary | ICD-10-CM

## 2015-04-27 DIAGNOSIS — O321XX1 Maternal care for breech presentation, fetus 1: Secondary | ICD-10-CM

## 2015-04-27 DIAGNOSIS — O24419 Gestational diabetes mellitus in pregnancy, unspecified control: Secondary | ICD-10-CM

## 2015-04-27 DIAGNOSIS — O34219 Maternal care for unspecified type scar from previous cesarean delivery: Secondary | ICD-10-CM

## 2015-04-27 LAB — POCT URINALYSIS DIP (DEVICE)
Bilirubin Urine: NEGATIVE
GLUCOSE, UA: NEGATIVE mg/dL
Ketones, ur: NEGATIVE mg/dL
Nitrite: NEGATIVE
PROTEIN: NEGATIVE mg/dL
SPECIFIC GRAVITY, URINE: 1.015 (ref 1.005–1.030)
UROBILINOGEN UA: 0.2 mg/dL (ref 0.0–1.0)
pH: 7 (ref 5.0–8.0)

## 2015-04-27 NOTE — Progress Notes (Signed)
Pt reports baby is breech.

## 2015-04-27 NOTE — Progress Notes (Signed)
Subjective:  Amber Black is a 34 y.o. G2P1001 at [redacted]w[redacted]d being seen today for ongoing prenatal care.  Patient reports no complaints.  Contractions: Not present.  Vag. Bleeding: None. Movement: Present. Denies leaking of fluid.   The following portions of the patient's history were reviewed and updated as appropriate: allergies, current medications, past family history, past medical history, past social history, past surgical history and problem list.   Objective:   Filed Vitals:   04/27/15 1054  BP: 108/67  Pulse: 72  Weight: 141 lb 12.8 oz (64.32 kg)    Fetal Status: Fetal Heart Rate (bpm): 144 Fundal Height: 34 cm Movement: Present  Presentation: Complete Breech  General:  Alert, oriented and cooperative. Patient is in no acute distress.  Skin: Skin is warm and dry. No rash noted.   Cardiovascular: Normal heart rate noted  Respiratory: Normal respiratory effort, no problems with respiration noted  Abdomen: Soft, gravid, appropriate for gestational age. Pain/Pressure: Absent     Pelvic: Vag. Bleeding: None     Cervical exam deferred        Extremities: Normal range of motion.  Edema: None  Mental Status: Normal mood and affect. Normal behavior. Normal judgment and thought content.   Urinalysis:    neg prot, neg gluc FBS 71-95 (1 out of range) 2 hour pp 75-128 (1 out of range) U/S shows 8 lb 3 oz, breech, normal fluid Assessment and Plan:  Pregnancy: G2P1001 at [redacted]w[redacted]d  1. Supervision of high risk pregnancy, antepartum, third trimester Continue routine prenatal care.  2. Gestational diabetes mellitus, antepartum Diet control only and BS is doing well  3. Previous cesarean delivery, antepartum Given that she declines ECV at present and still breech today--would schedule RCS at 39 wks.  4. Breech presentation, antepartum, fetus 1 Declines ECV  Term labor symptoms and general obstetric precautions including but not limited to vaginal bleeding, contractions, leaking of fluid and  fetal movement were reviewed in detail with the patient. Please refer to After Visit Summary for other counseling recommendations.  Return in 1 week (on 05/04/2015).   Reva Bores, MD

## 2015-04-27 NOTE — Patient Instructions (Addendum)
?  ? ??  ? ?  ?  ? ? ??   ?  ?    42 ?  29   9. ? ? 7. ? ?   ? ? ? ? ? 20    6-10   . BODY ??         ? ??   . ??       ? ?.       ?  ?.      25-35  (11-16 )        ?.    ?  ?           ?. ? ?  ?  ? ??      ?        ?  ? ?.  ? ? ?    ? ? ? ?      ?   ?.           ?   ?       ? ?   ?   ?.   (varicose )  ? ?  ?   ?.  ?    ?    ?  ? ?  ?      ? ??    . Backaches  ? ? ? ?    ? overexertion  ?  .     ? ??  ? ?. ?    ?   ?  ?   ? ??     ?.  ?  ?  ?  ?    ? ?          .    ? ?        ?      ? .  ?   ?  ? . ? ?    ? colostrum  ?  ?   ?.  ?      ? .  ?     ?  ?   ? ?    ?.  " " ?  ?   ? ?     ? .  ? ?        . ?    ?     ?        ? .  ? ?   (effaced ?)  ?   ?  ?   . YOUR PRENATAL  AT  ?    39.     ?   2        ?    ? . ?  ?   ?   :  ? ???  ?      ?      ?  .    ? ?  .   ?    ? ?  ?      .   ?  ? ??.    ? ? ?     ? ?  .   effaced ?    ?    ?   ?  ? ? ? ? ?    .    36 ?  ?  ? ?     ? ?    . ? ?  ?  ? ?    ?  ?  ?   ?   ? ?  . ?  ???  ?  ? ?  ?    ?    .       ?  ?   .          ?: ?  ? ? ?  ? . ?   .           ? .     ?     ?   ? ?  cramping    ? ? ? ?    .    ?  ? . ? ? ?   ? ??  ?   ?   ?     ?:   ?   ?  ( ? ?)   ? ? . ? ?  ? ?   ? ? ? ?  . ?   ?    ?   ?     ? . FALSE ?    ?           ?. ? ?   ?  ?    .    ? ?  ?     ?   ?  ? .      ? ? ? ? ?   ?  ? ?    ?   ?     ? . ? ?  ? ?  . ? 5    ?     .   ?       ?   ?   ? ?.   ??  ? ? ?    . ? ? ? ?        ?    .    37th     ? ?  ? ?    ? ?     .  ?     ?        ?  .  ? ?  ? ?   ? ? ?   unprescribed ?  ? ?.  ??    ?  ?   . ?     ?       ? ?   ?.  ? ?  ?     ?   ?  ?  ?.       ?   ?     . ??         ? ?  . ?        ? ?. ? ?    ? ? ? ? .  tubs    ? saunas    ?. ?     ? ?  . ? ?       ? ? ? ?   ?  ?.    ? ??        ? .     ?  . ?   ? (  ?    ? ? ?? )    ? ?    ?. ?  ? ?      ? ? ?  ? ?  .   ?  ? ? ?     ? ? ?  ?. ? ?   ?  ? ?       sitz .       ? ? ??  ? ?   ?.  varicose  ? ?  ? ? . 15  3-4  ?     ?  . ?  ?   .  ?  ?      ? ? ? .     ?  ? ?       ?    .     ?          ?.                ?      ?.       ?  ?  ? ?      ?   ?    .   ?  ?     ? ?    ? ?  ?     ?.      ?    ?  ?   ?  prenatal ?  . ?  ?     .    ? ?.  ? ? ? ?.       ? ?    ?    ?       ? ?. ? ?  IF  :   ? ?    ?  ?     ?? ?.   .   ??  ??  ?   ?   ?     .   ?  ?  .  ?    ?  .  ?      . ?   ? ?   IF:    .    ?  ?  ? ?   ?.    ?    ?      .  ? ? ?  ?  .  ??   ? ? ?  .   ? ?     ? ? shortness .         ?  ?  ? ?  .     ?   ? ?   ? ? .  ?   ?       .   ?? ?. ? ?: 08/30/2001 ?  ? : 09/10/2013 ?  ? ?: 11/06/2012 [employer] ? ?   2015 [employer]. ?   ?  ? ?   ?     ?  ?   ? ? .      ?   ?  ? ?     ?  ??? ?.        ?             ?   ?  ?  ? .     ? ??     ? ?   ?        ?    ? . ?  ?    ?  ?         ? ?  ?   ? ?  ? ?.        ?    ?         ?   ? ?          .       ?      ?      ? ?   ?     ?    ? ? ?  ?   ?.            (colostrum )         ? ?    .     ?   ?       ? ?? ?  ?.      ?    ?   ? ?   .    ? ?       ?       ?     ? ?    ?   ?. ?        ? ?    .    ?    ?  2 ?? mellitus ?  ?     .            ? ?  ?   ? ?  ?  ? .    . ?   ?     ? ?    ? ?   ?.  ??                ?  ? .  ??  prepregnancy        ? ?      (lochia)    ?  .   2 ?? mellitus ?  ? ?  ? ? ? ?  ? ? ?      ?  ?.  ?      ?    ?    ? ?. ?.    ? ? ?   ?.    ? ?  ?    stroked  ?      (rooting ).       ? ?  ?  ? squeaking .    ?. ? ?  ? ? .    ? ? fussing.     ?. ?  ? ?    ??            ?  ? ?      ?.        ? ?  ?  ?     ? : ???. ?    . .    ?    ?  ?  ?    ?   ?   ?    ?    . ( ? ? )  ? ? ?     ?          ? ? ? ?  ? ?.  ?               ?     ?   ?. ??? ?      ?   ?    ?.   ? ? .  ? ?   ?  ? ? ?   ?   ? ? ?  . ?    ?  ? ?.              ? ?      ? . ? 4 ?    ? ?        ? ?  ?.  ? ?              ?    ??? ?. ? ? ?          .       ? ?   ?       ? ?       ?       (areola ?)            ?    . ? areola ? ?   ?   ?     ?      .   ?   ?      ? ?  ?  . (latched ) ??? ?        ?       ? ?  .      ? ?   ?      ?  (everted).                ? 2-3       ?  . ? latching      ?   ? ?  ?   ? ?   . ?      ?          ? ?     ? ? ? ?     ?.         ?  latched ?    ?        ? .        fussy   ?.      ?      Burping   ?         ?.   ?  ?     ? ?  ?    ?   fussiness   ? ? . ?    ??     latched ?  :     ?   ?  tugging  ?  .  3-4  ?   ?.       ?       ? ?.    ??    latched ? ?   :   ?         .  .   ?      latched ? ?                ?           ? ? ? ?. ?     ? . ?   ? ?    ? ?: ?  ? ? ? ? ?    .  .   ?    ? ?.   ?    ?  ? ? ? ?  ? ?.  ?     ?   .  ?   ?: ? 1-3          ?   .      ?   ?  ?.          ?  ?     ?   ? ??.   ? ?   ?   . ?    ?   ?  ? 24  ?  ?    3  ?. ?  ?     ? 5   .  5  ?   24  ?  ?    3 .    ?    .  7  ?   24  ?  ?    3 .  ?    .    3    ?     10 ?  ?   ? .  4    4-7   (113-198 G) ?    .    2  ?     ? ?  ?  5  ?    ?. ?         ? ? ?    . ?  .      ?      ?       ? engorgement    ? ?   ?.    ? ?  ?       ?    ?    ?   . ? "   ."        (   ? ?   4-6   )    ?       ?  ?    ?.       ?  ?        ?. ? ?  ? ?        ?  ?   ?   (  SIDS)      ? ? .        ?        ?     ?  ?.           ?    .    unlatches ? ? ?        ? ?  ?  ?.    ?     ? ? ? ?     ?               ?  ?   ?.          ?.   ? ?   ???         ?  ?        ?        ?:   ( ?  ?     4  )  1-3    ?.      ?       ?   ? 3  ? 5  ?  .   24  ?  ?    8  ?         ?  ? 6  ?              .                     ?   ?      ?     ?            ? ?   ???    ?  ? . ?     ?    ?       ? ?   feedings        ? ?         .     ?   ? ?  ?     ?     ?   ?. A ?     ? ?    ? ? ?     ?  ?   ? ? .       ?  ? ? ?  ?  .    ?   ?  ?       ?.   ?    ?  ?      ?  . ?  ?   ? ?        ?  ?   ?. A   ?   ?       ? ? ?     ??          ?. YOU     ?   ?            ?.  ?   ? moisturized      ?    ?:           ?. ?  ? .  ?       ?      ? ?    ?      ?  ? ?  ?   ?   ?. underwire    ? ?      . ?     3-4      . ?           ? ? .  feedings  ?    ? ?   .        lanolin  ?. Lanolin  ?  ?  ? ?         .   lanolin   ?               ?  ? .  lanolin     ? ? .   ?            ?      ?           ?  ? ?.  ?   ?   ?  ? ?  ? (engorgement )  . engorgement   ? ?       ?    ?.    3-5     engorgement  ?  .  ?     ? engorgement :   ?       ?  ?.      ?    ( ? ?  ?  ??  ?  )   ?   ?      ?  . ?        ?  ? .            ?  ? ?         ?  ? ? ?  .    ? ?            1-2    ? snug ? ( ? ) ? ?  .   ?   ?   ? ?     ?     .    ??       latched      ?  ? ?  ?. engorgement     ? ?  48         ?  ? ?    ? ?   ?   ?. ?    ? ?        ? ? .     ?   ?  3 ? . ?              ? ? ?         ?    ?. ?   ??    ?        ?  ?  ? ?    .  ? ? (? 10 ? ? )          ? ? .       ??   ? ?         ?      ?.  ?   ?? ?.    ? ?  ? ?.   ?    ?.  ?                  ?    ?. ? ? ?    ?   ?  ?   ?    ?   ?     ?      ? ? ?.           . ?                ?   ?   ?   . ? ?  IF  :         ? ?    ?   ? ?   .   ? ?  .     ?    ?.  ?  ? ?  ?.  ?  ?  ?   .    ? ?  ? .  ? ?  .          ?  ?  .    ?    ? ?  ?  feedings   ? ?  .     .          ? ? .     ?   .     ? 24  ?  ?  3   ?   .     ? 24  ?  ?  3  .   ?  ?   ?  ?   ?    .    ?  5  ?     ? . ?   ? ?   IF:     ?  ()        ?  ?.     ?  . ? ?: 09/05/2005 ?  ? : 09/10/2013 ?  ? ?: 02/27/2013 [employer] ? ?   2015 [employer]. ?   ?  ? ?   ?     ?  ?   ? ? .      ?   ?  ? ?     ?  ??? ?. ?? Cephalic  ?? cephalic  ?  ?  ? ?    ?    ? (breech ?) ?    ?  ? () ?        ?   . ? ?  ?? ?      ? ?     ? ??? ?     .   36     ?   ?     ?  ? ? ? .    ?  .    4    ? .   ?    ?   ?   ? ? ?  .   ?    ?    ? ? .  ?   ?   ? ?     ? ?    ?  ? ? ?  .   ?    ?    ? ?  ?      ? ? ?   .   ? ?    ?      ?  ? ? ? ?     ?    ?    ?       ?   ?  .   ? ? anesthetics   ? ?     ? ?     ?.   ?    ?   ?          ?  ?     ? . ? ?     ? ? ?     ?  ?. ?       breech ? ?     ???    ?  . ? ?  ?   ? ?? ???  ECV     ?  . ?  ECV     ?      ???    ?  . ? ?? ?  ?     ?    ? ?   . ECV ? anesthesiologist    ? / ?   ? ?  . ?      ? ?    ? ? ??? ?   ? ?   . ? ??       ?  ?  ? . ? epidural ? ? ?   ?  ? .  ECV ? ??    . ? ?  ?   ??  ?     ?     ?    .  ? ?   RHO (D) ? ?       ?         ?  ?.  ?  ? ?  ???  ? ?    2  3     ?   ?  . ECV      ?       ?. ??? ?   .  ? ? ?    ?. ECV     ? ? ?   ?   (  abruption) ??.  ?      ?  ??? ?   ? ?.  ?. ? (  ) ?. ? ?    .  breech ? ?   ?    ? . ? ?  ?  ?      ?. ECV IF   :    .      .   ?      ? ?  . ECV   ?  : ?  ?   ? ?? ???  ? ? .  ?     . ? ? ?    ?  .  ? ?? ?  ? ? ?  ? . ? ?   ?   ? ? ?.  ? ( previa)      .  ? ?? ? ?  ? ? ? ?   ?  ??? ? . ?? (oligohydramnios) ? ? ? ?  ? .        ?    ? ?  (?).  ?  ruptured ? .  ? ?  ? ? ?  ?      . ?     ? .        ?         ? . ECV     ?     ?     ? .       ?  ? .   ?    ?  ? ?  . ?   ? ?   IF:   ?     ? ?.    ?  ?     ?  (?  ?     ?).  ??  ? ?.        ?  ?  ?  ? .   ? ?  ? ?.  ? 102  F (38.9  C) ?   ? ? ?   ? ?. ? ?: 02/28/2007 ?  ? : 01/20/2014 ?   ? ?:  12/24/2008 [employer] ? ?   2015 [employer]. ?   ?  ? ?   ?     ?  ?   ? ? .      ?   ?  ? ?     ?  ??? ?. External Cephalic Version External cephalic version is turning a baby that is presenting his or her buttocks first (breech) or is lying sideways in the uterus (transverse) to a head-first position. This makes the labor and delivery faster, safer for the mother and baby, and lessens the chance for a cesarean section. It should not be tried until the pregnancy is [redacted] weeks along or longer. BEFORE THE PROCEDURE   Do not take aspirin.  Do not eat for 4 hours before the procedure.  Tell your caregiver if you have a cold, fever, or an infection.  Tell your caregiver if you are having contractions.  Tell your caregiver if you are leaking or had a gush of fluid from your vagina.  Tell your caregiver if you have any vaginal bleeding or abnormal discharge.  If you are being admitted the same day, arrive at the hospital at least one hour before the procedure to sign any necessary documents and to get prepared for the procedure.  Tell your caregiver if you had any problems with anesthetics in the past.  Tell your caregiver if you are taking any medications that your caregiver does not know about. This includes over-the-counter and prescription drugs, herbs, eye drops and creams. PROCEDURE  First, an ultrasound is done to make sure the baby is breech or transverse.  A non-stress test or biophysical profile is done on the baby before the ECV. This is done to make sure it is safe for the baby to have the ECV. It may also be done after the procedure to make sure the baby is okay.  ECV is done in the delivery/surgical room with an anesthesiologist present. There should be a setup for an emergency cesarean section with a full nursing and nursery staff  available and ready.  The patient may be given a medication to relax the uterine muscles. An epidural may be given for any discomfort. It is helpful for the success of the ECV.  An electronic fetal monitor is placed on the uterus during the procedure to make sure the baby is okay.  If the mother is Rh-negative, Rho (D) immune globulin will be given to her to prevent Rh problems for future pregnancies.  The mother is followed closely for 2 to 3 hours after the procedure to make sure no problems develop. BENEFITS OF ECV  Easier and safer labor and delivery for the mother and baby.  Lower incidence of cesarean section.  Lower costs with a vaginal delivery. RISKS OF ECV  The placenta pulls away from the wall of the uterus before delivery (abruption of the placenta).  Rupture of the uterus, especially in patients with a previous cesarean section.  Fetal distress.  Early (premature) labor.  Premature rupture of the membranes.  The baby will return to the breech or transverse lie position.  Death of the fetus can happen but is very rare. ECV SHOULD BE STOPPED IF:  The fetal heart tones drop.  The mother is having a lot of pain.  You cannot turn the baby after several attempts. ECV SHOULD NOT BE DONE IF:  The non-stress test or biophysical profile is abnormal.  There is vaginal bleeding.  An abnormal shaped uterus  is present.  There is heart disease or uncontrolled high blood pressure in the mother.  There are twins or more.  The placenta covers the opening of the cervix (placenta previa).  You had a previous cesarean section with a classical incision or major surgery of the uterus.  There is not enough amniotic fluid in the sac (oligohydramnios).  The baby is too small for the pregnancy or has not developed normally (anomaly).  Your membranes have ruptured. HOME CARE INSTRUCTIONS   Have someone take you home after the procedure.  Rest at home for several  hours.  Have someone stay with you for a few hours after you get home.  After ECV, continue with your prenatal visits as directed.  Continue your regular diet, rest and activities.  Do not do any strenuous activities for a couple of days. SEEK IMMEDIATE MEDICAL CARE IF:   You develop vaginal bleeding.  You have fluid coming out of your vagina (bag of water may have broken).  You develop uterine contractions.  You do not feel the baby move or there is less movement of the baby.  You develop abdominal pain.  You develop an oral temperature of 102 F (38.9 C) or higher. Document Released: 02/28/2007 Document Revised: 01/20/2014 Document Reviewed: 12/24/2008 [facility] Patient Information 2015 [place], [place]. This information is not intended to replace advice given to you by your health care provider. Make sure you discuss any questions you have with your health care provider.

## 2015-04-28 ENCOUNTER — Encounter (HOSPITAL_COMMUNITY): Payer: Self-pay | Admitting: *Deleted

## 2015-05-02 ENCOUNTER — Encounter (HOSPITAL_COMMUNITY): Payer: Self-pay | Admitting: Anesthesiology

## 2015-05-02 NOTE — Anesthesia Preprocedure Evaluation (Deleted)
Anesthesia Evaluation  Patient identified by MRN, date of birth, ID band Patient awake    Reviewed: Allergy & Precautions, NPO status , Patient's Chart, lab work & pertinent test results, reviewed documented beta blocker date and time   Airway        Dental   Pulmonary          Cardiovascular negative cardio ROS      Neuro/Psych negative neurological ROS  negative psych ROS   GI/Hepatic negative GI ROS, Neg liver ROS,   Endo/Other  diabetes, Gestational  Renal/GU      Musculoskeletal   Abdominal   Peds  Hematology   Anesthesia Other Findings   Reproductive/Obstetrics (+) Pregnancy Breech                             Anesthesia Physical Anesthesia Plan  ASA: II  Anesthesia Plan: Spinal   Post-op Pain Management:    Induction:   Airway Management Planned:   Additional Equipment:   Intra-op Plan:   Post-operative Plan:   Informed Consent: I have reviewed the patients History and Physical, chart, labs and discussed the procedure including the risks, benefits and alternatives for the proposed anesthesia with the patient or authorized representative who has indicated his/her understanding and acceptance.     Plan Discussed with:   Anesthesia Plan Comments: (Check am labs)        Anesthesia Quick Evaluation

## 2015-05-03 ENCOUNTER — Inpatient Hospital Stay (HOSPITAL_COMMUNITY): Payer: 59 | Admitting: Anesthesiology

## 2015-05-03 ENCOUNTER — Encounter (HOSPITAL_COMMUNITY)
Admission: AD | Disposition: A | Discharge status: Home / Self Care | Payer: Self-pay | Source: Ambulatory Visit | Attending: Obstetrics & Gynecology

## 2015-05-03 ENCOUNTER — Inpatient Hospital Stay (HOSPITAL_COMMUNITY)
Admission: AD | Admit: 2015-05-03 | Discharge: 2015-05-05 | DRG: 765 | Disposition: A | Discharge status: Home / Self Care | Payer: 59 | Source: Ambulatory Visit | Attending: Obstetrics & Gynecology | Admitting: Obstetrics & Gynecology

## 2015-05-03 ENCOUNTER — Encounter (HOSPITAL_COMMUNITY): Payer: Self-pay | Admitting: *Deleted

## 2015-05-03 DIAGNOSIS — O4593 Premature separation of placenta, unspecified, third trimester: Secondary | ICD-10-CM | POA: Diagnosis present

## 2015-05-03 DIAGNOSIS — Z3A38 38 weeks gestation of pregnancy: Secondary | ICD-10-CM | POA: Diagnosis present

## 2015-05-03 DIAGNOSIS — O321XX0 Maternal care for breech presentation, not applicable or unspecified: Secondary | ICD-10-CM | POA: Diagnosis present

## 2015-05-03 DIAGNOSIS — O3421 Maternal care for scar from previous cesarean delivery: Principal | ICD-10-CM | POA: Diagnosis present

## 2015-05-03 DIAGNOSIS — O34219 Maternal care for unspecified type scar from previous cesarean delivery: Secondary | ICD-10-CM

## 2015-05-03 DIAGNOSIS — O321XX1 Maternal care for breech presentation, fetus 1: Secondary | ICD-10-CM

## 2015-05-03 DIAGNOSIS — O2492 Unspecified diabetes mellitus in childbirth: Secondary | ICD-10-CM | POA: Diagnosis present

## 2015-05-03 DIAGNOSIS — O99820 Streptococcus B carrier state complicating pregnancy: Secondary | ICD-10-CM

## 2015-05-03 DIAGNOSIS — Z9889 Other specified postprocedural states: Secondary | ICD-10-CM

## 2015-05-03 DIAGNOSIS — O0993 Supervision of high risk pregnancy, unspecified, third trimester: Secondary | ICD-10-CM

## 2015-05-03 DIAGNOSIS — O24419 Gestational diabetes mellitus in pregnancy, unspecified control: Secondary | ICD-10-CM

## 2015-05-03 DIAGNOSIS — O24429 Gestational diabetes mellitus in childbirth, unspecified control: Secondary | ICD-10-CM

## 2015-05-03 LAB — CBC
HCT: 36 % (ref 36.0–46.0)
HEMOGLOBIN: 11.8 g/dL — AB (ref 12.0–15.0)
MCH: 28.2 pg (ref 26.0–34.0)
MCHC: 32.8 g/dL (ref 30.0–36.0)
MCV: 85.9 fL (ref 78.0–100.0)
Platelets: 205 10*3/uL (ref 150–400)
RBC: 4.19 MIL/uL (ref 3.87–5.11)
RDW: 14.4 % (ref 11.5–15.5)
WBC: 9.2 10*3/uL (ref 4.0–10.5)

## 2015-05-03 LAB — TYPE AND SCREEN
ABO/RH(D): A POS
Antibody Screen: NEGATIVE

## 2015-05-03 LAB — GLUCOSE, CAPILLARY: GLUCOSE-CAPILLARY: 103 mg/dL — AB (ref 65–99)

## 2015-05-03 SURGERY — Surgical Case
Anesthesia: Spinal

## 2015-05-03 MED ORDER — DEXTROSE 5 % IV SOLN: 2.0000 g | INTRAVENOUS | Status: DC

## 2015-05-03 MED ORDER — ONDANSETRON HCL 4 MG/2ML IJ SOLN
INTRAMUSCULAR | Status: DC | PRN
  Administered 2015-05-03: 4 mg via INTRAVENOUS

## 2015-05-03 MED ORDER — MORPHINE SULFATE (PF) 0.5 MG/ML IJ SOLN
INTRAMUSCULAR | Status: DC | PRN
Start: 2015-05-03 — End: 2015-05-03
  Administered 2015-05-03: .2 mg via INTRATHECAL

## 2015-05-03 MED ORDER — LACTATED RINGERS IV SOLN
INTRAVENOUS | Status: DC
  Administered 2015-05-03 (×3): via INTRAVENOUS

## 2015-05-03 MED ORDER — CITRIC ACID-SODIUM CITRATE 334-500 MG/5ML PO SOLN
30.0000 mL | Freq: Once | ORAL | Status: AC
  Administered 2015-05-03: 30 mL via ORAL
  Filled 2015-05-03: qty 15

## 2015-05-03 MED ORDER — OXYTOCIN 40 UNITS IN LACTATED RINGERS INFUSION - SIMPLE MED
INTRAVENOUS | Status: DC | PRN
  Administered 2015-05-03: 40 mL via INTRAVENOUS

## 2015-05-03 MED ORDER — MEPERIDINE HCL 25 MG/ML IJ SOLN: 6.2500 mg | INTRAMUSCULAR | Status: DC | PRN

## 2015-05-03 MED ORDER — CEFAZOLIN SODIUM-DEXTROSE 2-3 GM-% IV SOLR
INTRAVENOUS | Status: AC
  Filled 2015-05-03: qty 50

## 2015-05-03 MED ORDER — EPHEDRINE SULFATE 50 MG/ML IJ SOLN
INTRAMUSCULAR | Status: DC | PRN
  Administered 2015-05-03: 10 mg via INTRAVENOUS
  Administered 2015-05-03: 5 mg via INTRAVENOUS
  Administered 2015-05-03: 10 mg via INTRAVENOUS
  Administered 2015-05-03: 5 mg via INTRAVENOUS

## 2015-05-03 MED ORDER — PHENYLEPHRINE 8 MG IN D5W 100 ML (0.08MG/ML) PREMIX OPTIME
INJECTION | INTRAVENOUS | Status: AC
  Filled 2015-05-03: qty 100

## 2015-05-03 MED ORDER — CEFAZOLIN SODIUM-DEXTROSE 2-3 GM-% IV SOLR: 2.0000 g | INTRAVENOUS | Status: DC

## 2015-05-03 MED ORDER — SCOPOLAMINE 1 MG/3DAYS TD PT72
MEDICATED_PATCH | TRANSDERMAL | Status: AC
  Filled 2015-05-03: qty 1

## 2015-05-03 MED ORDER — LACTATED RINGERS IV SOLN: INTRAVENOUS | Status: DC

## 2015-05-03 MED ORDER — BUPIVACAINE HCL (PF) 0.5 % IJ SOLN
INTRAMUSCULAR | Status: AC
  Filled 2015-05-03: qty 30

## 2015-05-03 MED ORDER — LACTATED RINGERS IV BOLUS (SEPSIS)
1000.0000 mL | Freq: Once | INTRAVENOUS | Status: AC
  Administered 2015-05-03: 1000 mL via INTRAVENOUS

## 2015-05-03 MED ORDER — MORPHINE SULFATE 0.5 MG/ML IJ SOLN
INTRAMUSCULAR | Status: AC
  Filled 2015-05-03: qty 100

## 2015-05-03 MED ORDER — FENTANYL CITRATE (PF) 100 MCG/2ML IJ SOLN: 25.0000 ug | INTRAMUSCULAR | Status: DC | PRN

## 2015-05-03 MED ORDER — 0.9 % SODIUM CHLORIDE (POUR BTL) OPTIME
TOPICAL | Status: DC | PRN
  Administered 2015-05-03: 1000 mL

## 2015-05-03 MED ORDER — FENTANYL CITRATE (PF) 100 MCG/2ML IJ SOLN
INTRAMUSCULAR | Status: AC
  Filled 2015-05-03: qty 4

## 2015-05-03 MED ORDER — FAMOTIDINE IN NACL 20-0.9 MG/50ML-% IV SOLN
20.0000 mg | Freq: Once | INTRAVENOUS | Status: AC
  Administered 2015-05-03: 20 mg via INTRAVENOUS
  Filled 2015-05-03: qty 50

## 2015-05-03 MED ORDER — BUPIVACAINE IN DEXTROSE 0.75-8.25 % IT SOLN
INTRATHECAL | Status: DC | PRN
  Administered 2015-05-03: 1.6 mL via INTRATHECAL

## 2015-05-03 MED ORDER — CEFAZOLIN SODIUM-DEXTROSE 2-3 GM-% IV SOLR
INTRAVENOUS | Status: DC | PRN
Start: 2015-05-03 — End: 2015-05-03
  Administered 2015-05-03: 2 g via INTRAVENOUS

## 2015-05-03 MED ORDER — KETOROLAC TROMETHAMINE 30 MG/ML IJ SOLN: 30.0000 mg | Freq: Four times a day (QID) | INTRAMUSCULAR | Status: DC | PRN

## 2015-05-03 MED ORDER — OXYTOCIN 10 UNIT/ML IJ SOLN
INTRAMUSCULAR | Status: AC
  Filled 2015-05-03: qty 4

## 2015-05-03 MED ORDER — PHENYLEPHRINE 8 MG IN D5W 100 ML (0.08MG/ML) PREMIX OPTIME
INJECTION | INTRAVENOUS | Status: DC | PRN
  Administered 2015-05-03: 80 ug/min via INTRAVENOUS

## 2015-05-03 MED ORDER — LACTATED RINGERS IV SOLN: INTRAVENOUS | Status: DC | PRN

## 2015-05-03 MED ORDER — KETOROLAC TROMETHAMINE 30 MG/ML IJ SOLN
INTRAMUSCULAR | Status: AC
  Filled 2015-05-03: qty 1

## 2015-05-03 MED ORDER — KETOROLAC TROMETHAMINE 30 MG/ML IJ SOLN
30.0000 mg | Freq: Four times a day (QID) | INTRAMUSCULAR | Status: DC | PRN
Start: 2015-05-03 — End: 2015-05-03
  Administered 2015-05-03: 30 mg via INTRAVENOUS

## 2015-05-03 MED ORDER — ONDANSETRON HCL 4 MG/2ML IJ SOLN
INTRAMUSCULAR | Status: AC
  Filled 2015-05-03: qty 2

## 2015-05-03 MED ORDER — BUPIVACAINE HCL (PF) 0.5 % IJ SOLN
INTRAMUSCULAR | Status: DC | PRN
  Administered 2015-05-03: 30 mL

## 2015-05-03 MED ORDER — SCOPOLAMINE 1 MG/3DAYS TD PT72
MEDICATED_PATCH | TRANSDERMAL | Status: DC | PRN
  Administered 2015-05-03: 1 via TRANSDERMAL

## 2015-05-03 MED ORDER — FENTANYL CITRATE (PF) 100 MCG/2ML IJ SOLN
INTRAMUSCULAR | Status: DC | PRN
  Administered 2015-05-03: 20 ug via INTRATHECAL

## 2015-05-03 MED ORDER — PROMETHAZINE HCL 25 MG/ML IJ SOLN: 6.2500 mg | INTRAMUSCULAR | Status: DC | PRN

## 2015-05-03 SURGICAL SUPPLY — 36 items
BENZOIN TINCTURE PRP APPL 2/3 (GAUZE/BANDAGES/DRESSINGS) ×3 IMPLANT
CLAMP CORD UMBIL (MISCELLANEOUS) IMPLANT
CLOSURE WOUND 1/2 X4 (GAUZE/BANDAGES/DRESSINGS) ×1
CLOTH BEACON ORANGE TIMEOUT ST (SAFETY) ×3 IMPLANT
DRAPE SHEET LG 3/4 BI-LAMINATE (DRAPES) IMPLANT
DRSG OPSITE POSTOP 4X10 (GAUZE/BANDAGES/DRESSINGS) ×3 IMPLANT
DRSG TELFA 3X8 NADH (GAUZE/BANDAGES/DRESSINGS) ×3 IMPLANT
DURAPREP 26ML APPLICATOR (WOUND CARE) ×3 IMPLANT
ELECT REM PT RETURN 9FT ADLT (ELECTROSURGICAL) ×3
ELECTRODE REM PT RTRN 9FT ADLT (ELECTROSURGICAL) ×1 IMPLANT
EXTRACTOR VACUUM KIWI (MISCELLANEOUS) IMPLANT
GLOVE BIO SURGEON STRL SZ7 (GLOVE) ×3 IMPLANT
GLOVE BIOGEL PI IND STRL 7.0 (GLOVE) ×1 IMPLANT
GLOVE BIOGEL PI INDICATOR 7.0 (GLOVE) ×2
GOWN STRL REUS W/TWL LRG LVL3 (GOWN DISPOSABLE) ×6 IMPLANT
GOWN STRL REUS W/TWL XL LVL3 (GOWN DISPOSABLE) ×3 IMPLANT
KIT ABG SYR 3ML LUER SLIP (SYRINGE) IMPLANT
NEEDLE HYPO 22GX1.5 SAFETY (NEEDLE) ×3 IMPLANT
NEEDLE HYPO 25X5/8 SAFETYGLIDE (NEEDLE) IMPLANT
NEEDLE KEITH (NEEDLE) ×3 IMPLANT
NS IRRIG 1000ML POUR BTL (IV SOLUTION) ×3 IMPLANT
PACK C SECTION WH (CUSTOM PROCEDURE TRAY) ×3 IMPLANT
PAD OB MATERNITY 4.3X12.25 (PERSONAL CARE ITEMS) ×3 IMPLANT
RTRCTR C-SECT PINK 25CM LRG (MISCELLANEOUS) IMPLANT
SPONGE SURGIFOAM ABS GEL 12-7 (HEMOSTASIS) IMPLANT
STRIP CLOSURE SKIN 1/2X4 (GAUZE/BANDAGES/DRESSINGS) ×2 IMPLANT
SUT PDS AB 0 CTX 60 (SUTURE) ×3 IMPLANT
SUT PLAIN 0 NONE (SUTURE) IMPLANT
SUT SILK 0 TIES 10X30 (SUTURE) IMPLANT
SUT VIC AB 0 CT1 36 (SUTURE) ×9 IMPLANT
SUT VIC AB 3-0 CT1 27 (SUTURE) ×2
SUT VIC AB 3-0 CT1 TAPERPNT 27 (SUTURE) ×1 IMPLANT
SUT VIC AB 4-0 KS 27 (SUTURE) ×3 IMPLANT
SYR CONTROL 10ML LL (SYRINGE) ×3 IMPLANT
TOWEL OR 17X24 6PK STRL BLUE (TOWEL DISPOSABLE) ×3 IMPLANT
TRAY FOLEY CATH SILVER 14FR (SET/KITS/TRAYS/PACK) ×3 IMPLANT

## 2015-05-03 NOTE — Op Note (Signed)
05/03/2015  9:24 PM  PATIENT:  Amber Black  34 y.o. female  PRE-OPERATIVE DIAGNOSIS:  Previous cesarean section; breech presentation POST-OPERATIVE DIAGNOSIS: Previous cesarean section; breech presentation  PROCEDURE:  Procedure(s): CESAREAN SECTION (N/A)- repeat  SURGEON:  Surgeon(s) and Role:    * Willodean Rosenthal, MD - Primary  ASSISTANTS: Zerita Boers, CNM   ANESTHESIA:   spinal  EBL:  Total I/O In: 2200 [I.V.:2200] Out: 1000 [Urine:300; Blood:700]  BLOOD ADMINISTERED:none  DRAINS: none   LOCAL MEDICATIONS USED:  MARCAINE     SPECIMEN:  Source of Specimen:  placenta  DISPOSITION OF SPECIMEN:  PATHOLOGY  COUNTS:  YES  TOURNIQUET:  * No tourniquets in log *  DICTATION: .Note written in EPIC  PLAN OF CARE: Admit to inpatient   PATIENT DISPOSITION:  PACU - hemodynamically stable.   Delay start of Pharmacological VTE agent (>24hrs) due to surgical blood loss or risk of bleeding: yes  Complications: none immediate   INDICATIONS: Amber Black is a 34 y.o. [id number] at [redacted]w[redacted]d here for repeat low transverse cesarean section secondary to the indications listed under preoperative diagnosis; please see preoperative note for further details.  The risks of cesarean section were discussed with the patient including but were not limited to: bleeding which may require transfusion or reoperation; infection which may require antibiotics; injury to bowel, bladder, ureters or other surrounding organs; injury to the fetus; need for additional procedures including hysterectomy in the event of a life-threatening hemorrhage; placental abnormalities wth subsequent pregnancies, incisional problems, thromboembolic phenomenon and other postoperative/anesthesia complications.   The patient concurred with the proposed plan, giving informed written consent for the procedure.  A [place] interpreter was used throughout the entire procedure.  FINDINGS:  Viable female infant in cephalic  presentation.  Clear amniotic fluid.  Intact placenta, three vessel cord.  Normal uterus, fallopian tubes and ovaries bilaterally.  PROCEDURE IN DETAIL:  The patient preoperatively received intravenous antibiotics and had sequential compression devices applied to her lower extremities.  She was then taken to the operating room where spinal anesthesia was administered and was found to be adequate. She was then placed in a dorsal supine position with a leftward tilt, and prepped and draped in a sterile manner.  A foley catheter was placed into her bladder and attached to constant gravity.  After an adequate timeout was performed, a Pfannenstiel skin incision was made with scalpel and carried through to the underlying layer of fascia. The fascia was incised in the midline, and this incision was extended bilaterally using the Mayo scissors.  Kocher clamps were applied to the superior aspect of the fascial incision and the underlying rectus muscles were dissected off bluntly. A similar process was carried out on the inferior aspect of the fascial incision. The rectus muscles were separated in the midline bluntly and the peritoneum was entered bluntly.  Attention was turned to the lower uterine segment where a low transverse hysterotomy incision was made with a scalpel and extended bilaterally bluntly.  The infant was successfully delivered, the cord was clamped and cut after 1 min delayed cord clamp and the infant was handed over to awaiting neonatology team. The placenta was delivered manually. Uterine massage was then administered.  The placenta was intact with a three-vessel cord. The uterus was then cleared of clot and debris.  The uterus was exteriorized and the hysterotomy was closed with 0 Vicryl in a running locked fashion, and an imbricating layer was also placed with the same suture. The uterus was returned  to the pelvis. The pelvis was cleared of all clot and debris. Hemostasis was confirmed on all surfaces.   The peritoneum and the muscles were reapproximated using 0 Vicryl with 1 interrupted suture. The fascia was then closed using 0 Vicryl.  The subcutaneous layer was irrigated and the skin was closed with a 4-0 Vicryl subcuticular stitch.  30 cc of 0.5% marcaine was injected into the incision and benzoin and steristrips were applied.  The patient tolerated the procedure well. Sponge, lap, instrument and needle counts were correct x 2.  She was taken to the recovery room in stable condition.   Mihika Surrette L. Harraway-Smith, M.D., [facility]

## 2015-05-03 NOTE — Anesthesia Procedure Notes (Signed)
Spinal Patient location during procedure: OB Staffing Anesthesiologist: Clarabel Marion, CHRIS Preanesthetic Checklist Completed: patient identified, surgical consent, pre-op evaluation, timeout performed, IV checked, risks and benefits discussed and monitors and equipment checked Spinal Block Patient position: sitting Prep: site prepped and draped and DuraPrep Patient monitoring: heart rate, cardiac monitor, continuous pulse ox and blood pressure Approach: midline Location: L3-4 Injection technique: single-shot Needle Needle type: Pencan  Needle gauge: 24 G Needle length: 10 cm Assessment Sensory level: T4   

## 2015-05-03 NOTE — H&P (Signed)
Amber Black is a 34 y.o. female G2P1 @ 38.2 wks presenting for contractions and vag bleeding that started 2 hrs ago. Maternal Medical History:  Reason for admission: Contractions and vaginal bleeding.   Contractions: Onset was 1-2 hours ago.   Frequency: regular.    Fetal activity: Perceived fetal activity is normal.   Last perceived fetal movement was within the past hour.    Prenatal complications: no prenatal complications   OB History    Gravida Para Term Preterm AB TAB SAB Ectopic Multiple Living   Past Medical History  Diagnosis Date  . Diabetes mellitus without complication    Past Surgical History  Procedure Laterality Date  . Cesarean section     Family History: family history is not on file. Social History:  reports that she has never smoked. She has never used smokeless tobacco. She reports that she does not drink alcohol or use illicit drugs.   Prenatal Transfer Tool  Maternal Diabetes: No Genetic Screening: Normal Maternal Ultrasounds/Referrals: Normal Fetal Ultrasounds or other Referrals:  None Maternal Substance Abuse:  No Significant Maternal Medications:  None Significant Maternal Lab Results:  None Other Comments:  None  Review of Systems  Constitutional: Negative.   HENT: Negative.   Eyes: Negative.   Respiratory: Negative.   Cardiovascular: Negative.   Gastrointestinal: Positive for abdominal pain.  Genitourinary:       Sm amt dark vag bleeding with sm dark clots  Musculoskeletal: Negative.   Skin: Negative.   Neurological: Negative.   Endo/Heme/Allergies: Negative.   Psychiatric/Behavioral: Negative.       Last menstrual period 08/05/2014, not currently breastfeeding. Maternal Exam:  Uterine Assessment: Contraction strength is moderate.  Contraction frequency is regular.  q 3-4 min apart  Abdomen: Patient reports generalized tenderness.  Surgical scars: low transverse.   Fetal presentation: breech  Introitus:  Normal vulva. Normal vagina.  Amniotic fluid character: not assessed.  Pelvis: adequate for delivery.   Cervix: Cervix evaluated by digital exam.   SVE ft/th/post/-2 smamt vag bleeding with sm clots  Fetal Exam Fetal Monitor Review: Mode: ultrasound.   Variability: moderate (6-25 bpm).   Pattern: variable decelerations.    Fetal State Assessment: Category II - tracings are indeterminate.     Physical Exam  Constitutional: She is oriented to person, place, and time. She appears well-developed and well-nourished.  HENT:  Head: Normocephalic.  Neck: Normal range of motion.  Cardiovascular: Normal rate, regular rhythm, normal heart sounds and intact distal pulses.   Respiratory: Effort normal and breath sounds normal.  GI: Soft. Bowel sounds are normal. There is generalized tenderness.  Genitourinary: Vagina normal and uterus normal.  Musculoskeletal: Normal range of motion.  Neurological: She is alert and oriented to person, place, and time. She has normal reflexes.  Skin: Skin is warm and dry.  Psychiatric: She has a normal mood and affect. Her behavior is normal. Judgment and thought content normal.    Prenatal labs: ABO, Rh: A/Positive/-- (03/08 0000) Antibody: Negative (03/08 0000) Rubella: Immune (03/08 0000) RPR: NON REAC (07/11 1046)  HBsAg: Negative (03/08 0000)  HIV: NONREACTIVE (07/11 1046)  GBS: Positive (07/25 0000)   Assessment/Plan: Early labor Suspicion abruption Variable decels Breech confirmed by bedside u/s Dr. Erin Fulling consulted and made aware of pts admission and condition. Orders given.   Amber Black 05/03/2015, 7:50 PM

## 2015-05-03 NOTE — MAU Note (Signed)
Patient presents to MAU with c/o contractions every five minutes for the last 1.5 hours; 8/10 on pain scale. Denies LOF at this time. Reports blood show. +FM. As of last appointment was told baby was breech.  Patient Inquiring about VBAC.

## 2015-05-03 NOTE — Anesthesia Preprocedure Evaluation (Signed)
Anesthesia Evaluation  Patient identified by MRN, date of birth, ID band Patient awake    Reviewed: Allergy & Precautions, NPO status , Patient's Chart, lab work & pertinent test results  History of Anesthesia Complications Negative for: history of anesthetic complications  Airway Mallampati: II  TM Distance: >3 FB Neck ROM: Full    Dental  (+) Teeth Intact   Pulmonary neg pulmonary ROS,  breath sounds clear to auscultation        Cardiovascular negative cardio ROS  Rhythm:Regular     Neuro/Psych negative neurological ROS  negative psych ROS   GI/Hepatic negative GI ROS, Neg liver ROS,   Endo/Other  diabetes  Renal/GU negative Renal ROS     Musculoskeletal   Abdominal   Peds  Hematology negative hematology ROS (+)   Anesthesia Other Findings   Reproductive/Obstetrics (+) Pregnancy                             Anesthesia Physical Anesthesia Plan  ASA: II  Anesthesia Plan: Spinal   Post-op Pain Management:    Induction:   Airway Management Planned: Nasal Cannula  Additional Equipment: None  Intra-op Plan:   Post-operative Plan:   Informed Consent: I have reviewed the patients History and Physical, chart, labs and discussed the procedure including the risks, benefits and alternatives for the proposed anesthesia with the patient or authorized representative who has indicated his/her understanding and acceptance.   Dental advisory given  Plan Discussed with: CRNA and Surgeon  Anesthesia Plan Comments:         Anesthesia Quick Evaluation

## 2015-05-03 NOTE — Transfer of Care (Signed)
Immediate Anesthesia Transfer of Care Note  Patient: Amber Black  Procedure(s) Performed: Procedure(s): CESAREAN SECTION (N/A)  Patient Location: PACU  Anesthesia Type:Spinal  Level of Consciousness: awake, alert  and oriented  Airway & Oxygen Therapy: Patient Spontanous Breathing  Post-op Assessment: Report given to RN and Post -op Vital signs reviewed and stable  Post vital signs: Reviewed and stable  Last Vitals:  Filed Vitals:   05/03/15 1942  BP: 102/60  Pulse: 71  Temp: 36.6 C  Resp: 17    Complications: No apparent anesthesia complications

## 2015-05-03 NOTE — Brief Op Note (Signed)
05/03/2015  9:24 PM  PATIENT:  Amber Black  34 y.o. female  PRE-OPERATIVE DIAGNOSIS:  Previous cesarean section; breech presentation POST-OPERATIVE DIAGNOSIS: Previous cesarean section; breech presentation  PROCEDURE:  Procedure(s): CESAREAN SECTION (N/A)- repeat  SURGEON:  Surgeon(s) and Role:    * Willodean Rosenthal, MD - Primary  ASSISTANTS: Zerita Boers, CNM   ANESTHESIA:   spinal  EBL:  Total I/O In: 2200 [I.V.:2200] Out: 1000 [Urine:300; Blood:700]  BLOOD ADMINISTERED:none  DRAINS: none   LOCAL MEDICATIONS USED:  MARCAINE     SPECIMEN:  Source of Specimen:  placenta  DISPOSITION OF SPECIMEN:  PATHOLOGY  COUNTS:  YES  TOURNIQUET:  * No tourniquets in log *  DICTATION: .Note written in EPIC  PLAN OF CARE: Admit to inpatient   PATIENT DISPOSITION:  PACU - hemodynamically stable.   Delay start of Pharmacological VTE agent (>24hrs) due to surgical blood loss or risk of bleeding: yes  Complications: none immediate

## 2015-05-04 ENCOUNTER — Encounter: Payer: 59 | Admitting: Family Medicine

## 2015-05-04 ENCOUNTER — Inpatient Hospital Stay (HOSPITAL_COMMUNITY)
Admission: RE | Admit: 2015-05-04 | Discharge: 2015-05-04 | Disposition: A | Discharge status: Home / Self Care | Payer: 59 | Source: Ambulatory Visit

## 2015-05-04 ENCOUNTER — Encounter (HOSPITAL_COMMUNITY): Payer: Self-pay | Admitting: Obstetrics & Gynecology

## 2015-05-04 DIAGNOSIS — Z9889 Other specified postprocedural states: Secondary | ICD-10-CM

## 2015-05-04 LAB — CBC
HEMATOCRIT: 31.2 % — AB (ref 36.0–46.0)
Hemoglobin: 10.1 g/dL — ABNORMAL LOW (ref 12.0–15.0)
MCH: 27.9 pg (ref 26.0–34.0)
MCHC: 32.4 g/dL (ref 30.0–36.0)
MCV: 86.2 fL (ref 78.0–100.0)
PLATELETS: 162 10*3/uL (ref 150–400)
RBC: 3.62 MIL/uL — ABNORMAL LOW (ref 3.87–5.11)
RDW: 14.5 % (ref 11.5–15.5)
WBC: 9.3 10*3/uL (ref 4.0–10.5)

## 2015-05-04 LAB — RPR: RPR: NONREACTIVE

## 2015-05-04 LAB — ABO/RH: ABO/RH(D): A POS

## 2015-05-04 MED ORDER — PRENATAL MULTIVITAMIN CH
1.0000 | ORAL_TABLET | Freq: Every day | ORAL | Status: DC
  Administered 2015-05-04 – 2015-05-05 (×2): 1 via ORAL
  Filled 2015-05-04 (×2): qty 1

## 2015-05-04 MED ORDER — NALBUPHINE HCL 10 MG/ML IJ SOLN: 5.0000 mg | Freq: Once | INTRAMUSCULAR | Status: DC | PRN

## 2015-05-04 MED ORDER — OXYTOCIN 40 UNITS IN LACTATED RINGERS INFUSION - SIMPLE MED: 62.5000 mL/h | INTRAVENOUS | Status: AC

## 2015-05-04 MED ORDER — NALOXONE HCL 0.4 MG/ML IJ SOLN: 0.4000 mg | INTRAMUSCULAR | Status: DC | PRN

## 2015-05-04 MED ORDER — IBUPROFEN 600 MG PO TABS
600.0000 mg | ORAL_TABLET | Freq: Four times a day (QID) | ORAL | Status: DC
  Administered 2015-05-04 – 2015-05-05 (×6): 600 mg via ORAL
  Filled 2015-05-04 (×6): qty 1

## 2015-05-04 MED ORDER — DIBUCAINE 1 % RE OINT: 1.0000 "application " | TOPICAL_OINTMENT | RECTAL | Status: DC | PRN

## 2015-05-04 MED ORDER — NALBUPHINE HCL 10 MG/ML IJ SOLN: 5.0000 mg | INTRAMUSCULAR | Status: DC | PRN

## 2015-05-04 MED ORDER — DIPHENHYDRAMINE HCL 50 MG/ML IJ SOLN: 12.5000 mg | INTRAMUSCULAR | Status: DC | PRN

## 2015-05-04 MED ORDER — SODIUM CHLORIDE 0.9 % IJ SOLN: 3.0000 mL | INTRAMUSCULAR | Status: DC | PRN

## 2015-05-04 MED ORDER — SIMETHICONE 80 MG PO CHEW: 80.0000 mg | CHEWABLE_TABLET | ORAL | Status: DC | PRN

## 2015-05-04 MED ORDER — ACETAMINOPHEN 500 MG PO TABS
1000.0000 mg | ORAL_TABLET | Freq: Four times a day (QID) | ORAL | Status: AC
  Administered 2015-05-04 (×2): 1000 mg via ORAL
  Filled 2015-05-04 (×2): qty 2

## 2015-05-04 MED ORDER — ZOLPIDEM TARTRATE 5 MG PO TABS: 5.0000 mg | ORAL_TABLET | Freq: Every evening | ORAL | Status: DC | PRN

## 2015-05-04 MED ORDER — OXYCODONE-ACETAMINOPHEN 5-325 MG PO TABS
2.0000 | ORAL_TABLET | ORAL | Status: DC | PRN
  Administered 2015-05-04 – 2015-05-05 (×3): 2 via ORAL
  Filled 2015-05-04 (×4): qty 2

## 2015-05-04 MED ORDER — LACTATED RINGERS IV SOLN
INTRAVENOUS | Status: DC
  Administered 2015-05-04: 13:00:00 via INTRAVENOUS

## 2015-05-04 MED ORDER — OXYCODONE-ACETAMINOPHEN 5-325 MG PO TABS: 1.0000 | ORAL_TABLET | ORAL | Status: DC | PRN

## 2015-05-04 MED ORDER — NALOXONE HCL 1 MG/ML IJ SOLN
1.0000 ug/kg/h | INTRAVENOUS | Status: DC | PRN
  Filled 2015-05-04: qty 2

## 2015-05-04 MED ORDER — DIPHENHYDRAMINE HCL 25 MG PO CAPS: 25.0000 mg | ORAL_CAPSULE | Freq: Four times a day (QID) | ORAL | Status: DC | PRN

## 2015-05-04 MED ORDER — LANOLIN HYDROUS EX OINT: 1.0000 "application " | TOPICAL_OINTMENT | CUTANEOUS | Status: DC | PRN

## 2015-05-04 MED ORDER — TETANUS-DIPHTH-ACELL PERTUSSIS 5-2.5-18.5 LF-MCG/0.5 IM SUSP: 0.5000 mL | Freq: Once | INTRAMUSCULAR | Status: DC

## 2015-05-04 MED ORDER — MENTHOL 3 MG MT LOZG: 1.0000 | LOZENGE | OROMUCOSAL | Status: DC | PRN

## 2015-05-04 MED ORDER — ACETAMINOPHEN 325 MG PO TABS: 650.0000 mg | ORAL_TABLET | ORAL | Status: DC | PRN

## 2015-05-04 MED ORDER — SCOPOLAMINE 1 MG/3DAYS TD PT72: 1.0000 | MEDICATED_PATCH | Freq: Once | TRANSDERMAL | Status: DC

## 2015-05-04 MED ORDER — ONDANSETRON HCL 4 MG/2ML IJ SOLN: 4.0000 mg | Freq: Three times a day (TID) | INTRAMUSCULAR | Status: DC | PRN

## 2015-05-04 MED ORDER — SIMETHICONE 80 MG PO CHEW
80.0000 mg | CHEWABLE_TABLET | Freq: Three times a day (TID) | ORAL | Status: DC
  Administered 2015-05-04 – 2015-05-05 (×4): 80 mg via ORAL
  Filled 2015-05-04 (×4): qty 1

## 2015-05-04 MED ORDER — MEASLES, MUMPS & RUBELLA VAC ~~LOC~~ INJ: 0.5000 mL | INJECTION | Freq: Once | SUBCUTANEOUS | Status: DC

## 2015-05-04 MED ORDER — DIPHENHYDRAMINE HCL 25 MG PO CAPS: 25.0000 mg | ORAL_CAPSULE | ORAL | Status: DC | PRN

## 2015-05-04 MED ORDER — SENNOSIDES-DOCUSATE SODIUM 8.6-50 MG PO TABS
2.0000 | ORAL_TABLET | ORAL | Status: DC
  Administered 2015-05-04: 2 via ORAL
  Filled 2015-05-04: qty 2

## 2015-05-04 MED ORDER — SIMETHICONE 80 MG PO CHEW
80.0000 mg | CHEWABLE_TABLET | ORAL | Status: DC
  Administered 2015-05-04: 80 mg via ORAL
  Filled 2015-05-04: qty 1

## 2015-05-04 MED ORDER — WITCH HAZEL-GLYCERIN EX PADS: 1.0000 "application " | MEDICATED_PAD | CUTANEOUS | Status: DC | PRN

## 2015-05-04 NOTE — Anesthesia Postprocedure Evaluation (Signed)
  Anesthesia Post-op Note  Patient: Amber Black  Procedure(s) Performed: Procedure(s): CESAREAN SECTION (N/A)  Patient Location: Mother/Baby  Anesthesia Type:Spinal  Level of Consciousness: awake  Airway and Oxygen Therapy: Patient Spontanous Breathing  Post-op Pain: mild  Post-op Assessment: Patient's Cardiovascular Status Stable and Respiratory Function Stable LLE Motor Response: Purposeful movement LLE Sensation: Tingling RLE Motor Response: Purposeful movement RLE Sensation: Tingling      Post-op Vital Signs: stable  Last Vitals:  Filed Vitals:   05/04/15 0630  BP: 100/51  Pulse: 64  Temp: 36.4 C  Resp: 18    Complications: No apparent anesthesia complications

## 2015-05-04 NOTE — Progress Notes (Signed)
Subjective: Postpartum Day 1: Cesarean Delivery Patient reports incisional pain and tolerating PO.    Objective: Vital signs in last 24 hours: Temp:  [97.3 F (36.3 C)-98.8 F (37.1 C)] 97.6 F (36.4 C) (08/15 0630) Pulse Rate:  [56-83] 64 (08/15 0630) Resp:  [17-22] 18 (08/15 0630) BP: (96-121)/(42-70) 100/51 mmHg (08/15 0630) SpO2:  [97 %-100 %] 97 % (08/15 0630) Weight:  [141 lb (63.957 kg)] 141 lb (63.957 kg) (08/14 2032)  Physical Exam:  General: alert, cooperative, appears stated age and no distress Lochia: appropriate Uterine Fundus: firm Incision: healing well, no significant drainage, no dehiscence, no significant erythema DVT Evaluation: No evidence of DVT seen on physical exam. Negative Homan's sign. No cords or calf tenderness.   Recent Labs  05/03/15 2005 05/04/15 0500  HGB 11.8* 10.1*  HCT 36.0 31.2*    Assessment/Plan: Status post Cesarean section. Doing well postoperatively.  Continue current care.  [name] 05/04/2015, 7:38 AM

## 2015-05-04 NOTE — Addendum Note (Signed)
Addendum  created 05/04/15 0816 by Renford Dills, CRNA   Modules edited: Charges VN, Notes Section   Notes Section:  File: [id number]

## 2015-05-04 NOTE — Anesthesia Postprocedure Evaluation (Signed)
  Anesthesia Post-op Note  Patient: Amber Black  Procedure(s) Performed: Procedure(s): CESAREAN SECTION (N/A)  Patient Location: PACU  Anesthesia Type:Spinal  Level of Consciousness: awake  Airway and Oxygen Therapy: Patient Spontanous Breathing  Post-op Pain: none  Post-op Assessment: Post-op Vital signs reviewed, Patient's Cardiovascular Status Stable, Respiratory Function Stable, Patent Airway, No signs of Nausea or vomiting and Pain level controlled LLE Motor Response: Purposeful movement LLE Sensation: Tingling RLE Motor Response: Purposeful movement RLE Sensation: Tingling      Post-op Vital Signs: Reviewed and stable  Last Vitals:  Filed Vitals:   05/04/15 0630  BP: 100/51  Pulse: 64  Temp: 36.4 C  Resp: 18    Complications: No apparent anesthesia complications

## 2015-05-04 NOTE — Lactation Note (Signed)
This note was copied from the chart of Amber Black. Lactation Consultation Note  Patient Name: Amber Black Today's Date: 05/04/2015 Reason for consult: Initial assessment   With this mom of a term baby, now 28 hours old. This is mom's second child, but first time breast feeding. Mom is from [place], and speaks Urdu, so I used Nurse, learning disability to do teaching. I assisted mom with latching baby skin to skin, in cross cradle position. Mom has nice evert nipples, and lots of colostrum. The baby latched fairly easily, was on and off the first few minutes, due to a stuffy nose, but then calmed, and suckled well, with a good latch. Mom awkward with holding the baby, and will need more practice with assistance. Mom sleepy and seemed uncomfortable prior to latch. Lactation brochure briefly shown to mom, and mom aware services available after discharge.  The baby 's tem was low, to I told mom to try and keep the baby skin to skin, to warm her, until the nurse cam back to check her temperature.    Maternal Data Formula Feeding for Exclusion: No Has patient been taught Hand Expression?: Yes Does the patient have breastfeeding experience prior to this delivery?: No  Feeding Feeding Type: Breast Fed Length of feed: 20 min  LATCH Score/Interventions Latch: Repeated attempts needed to sustain latch, nipple held in mouth throughout feeding, stimulation needed to elicit sucking reflex. (baby with stuffy nose, on and off for first few minutes) Intervention(s): Adjust position;Assist with latch  Audible Swallowing: A few with stimulation (lots of easily expressed colostrum)  Type of Nipple: Everted at rest and after stimulation  Comfort (Breast/Nipple): Soft / non-tender     Hold (Positioning): Assistance needed to correctly position infant at breast and maintain latch. Intervention(s): Breastfeeding basics reviewed;Support Pillows;Position options;Skin to skin  LATCH Score:  7  Lactation Tools Discussed/Used     Consult Status Consult Status: Follow-up Date: 05/05/15 Follow-up type: In-patient    Alfred Levins 05/04/2015, 2:06 PM

## 2015-05-05 ENCOUNTER — Encounter (HOSPITAL_COMMUNITY): Admission: RE | Payer: Self-pay | Source: Ambulatory Visit

## 2015-05-05 ENCOUNTER — Inpatient Hospital Stay (HOSPITAL_COMMUNITY): Admission: RE | Admit: 2015-05-05 | Payer: 59 | Source: Ambulatory Visit | Admitting: Obstetrics & Gynecology

## 2015-05-05 SURGERY — Surgical Case
Anesthesia: Regional

## 2015-05-05 MED ORDER — OXYCODONE-ACETAMINOPHEN 5-325 MG PO TABS: 1.0000 | ORAL_TABLET | ORAL | Status: AC | PRN

## 2015-05-05 MED ORDER — IBUPROFEN 600 MG PO TABS: 600.0000 mg | ORAL_TABLET | Freq: Four times a day (QID) | ORAL | Status: AC | PRN

## 2015-05-05 NOTE — Discharge Summary (Signed)
Obstetric Discharge Summary Reason for Admission: onset of labor w/ breech presentation and prev C/S Prenatal Procedures: ultrasound Intrapartum Procedures: cesarean: low cervical, transverse Postpartum Procedures: none Complications-Operative and Postpartum: none HEMOGLOBIN  Date Value Ref Range Status  05/04/2015 10.1* 12.0 - 15.0 g/dL Final  09/81/1914 78.2 g/dL Final   HCT  Date Value Ref Range Status  05/04/2015 31.2* 36.0 - 46.0 % Final  11/25/2014 38 % Final   Amber Black is a 34yo G2P1001 @ 38.5wks who presents to [facility] w/ reg ctx and vag bldg along w/ FHR variables and breech presentation. It is recommended that she had a repeat C/S and she tolerated that well. By POD#2 she is doing well and is ready for d/c home. She is breastfeeding and would like contraception but would like her husband present for the discussion. This will take place at her PP visit. She will need a 2 hr glucola at her PP visit as well.  Physical Exam: (visit w/ Urdu interpreter via [place]) General: alert, cooperative and no distress  Heart: RRR Lungs: nl effort Lochia: appropriate Uterine Fundus: firm Incision: honeycomb intact, marked and unchanged DVT Evaluation: No evidence of DVT seen on physical exam.  Discharge Diagnoses: Term Pregnancy-delivered  Discharge Information: Date: 05/05/2015 Activity: pelvic rest Diet: routine Medications: PNV, Ibuprofen and Percocet Condition: stable Instructions: refer to practice specific booklet Discharge to: home Follow-up Information    Follow up with [facility]. Schedule an appointment as soon as possible for a visit in 4 weeks.   Why:  For your postpartum appointment.   Contact information:   [address] [place] [address] [phone]      Newborn Data: Live born female  Birth Weight: 7 lb 5 oz (3317 g) APGAR: 8, 9  Home with mother.  Cam Hai CNM 05/05/2015, 7:57 AM

## 2015-05-05 NOTE — Discharge Instructions (Signed)

## 2015-05-05 NOTE — Progress Notes (Signed)
Discharge teaching complete with the use of an interpretor. Interpretor stated pt understood all information and did not have any questions. Pt ambulated out of the hospital and discharged home to family.

## 2015-06-15 ENCOUNTER — Ambulatory Visit (INDEPENDENT_AMBULATORY_CARE_PROVIDER_SITE_OTHER): Payer: 59 | Admitting: Obstetrics & Gynecology

## 2015-06-15 ENCOUNTER — Encounter: Payer: Self-pay | Admitting: Obstetrics & Gynecology

## 2015-06-15 NOTE — Progress Notes (Signed)
Patient ID: Amber Black, female   DOB: 26-Jun-1981, 34 y.o.   MRN: [id number] Subjective:   cc: postpartum  Amber Black is a 34 y.o. female who presents for a postpartum visit. She is 6 weeks postpartum following a low cervical transverse Cesarean section. I have fully reviewed the prenatal and intrapartum course. The delivery was at *38 gestational weeks. Outcome: repeat cesarean section, low transverse incision. Anesthesia: spinal. Postpartum course has been good. Baby's course has been normal. Baby is feeding by breast. Bleeding no bleeding. Bowel function is normal. Bladder function is normal. Patient is not sexually active. Contraception method is condoms. Postpartum depression screening: negative.  The following portions of the patient's history were reviewed and updated as appropriate: allergies, current medications, past family history, past medical history, past social history, past surgical history and problem list.  Review of Systems Pertinent items are noted in HPI.   Objective:    There were no vitals taken for this visit.  General:  alert, cooperative and no distress           Abdomen: normal findings: soft, non-tender and incision healing well   Vulva:  not evaluated  Vagina: not evaluated        Adnexa:  not evaluated  Rectal Exam: Not performed.        Assessment:     normal  postpartum exam. Pap smear not done at today's visit.   Plan:    1. Contraception: condoms 2. Needs 2 hr GTT  3. Follow up as needed.    Adam Phenix, MD 06/15/2015

## 2015-06-15 NOTE — Patient Instructions (Signed)
Contraception Choices Contraception (birth control) is the use of any methods or devices to prevent pregnancy. Below are some methods to help avoid pregnancy. HORMONAL METHODS   Contraceptive implant. This is a thin, plastic tube containing progesterone hormone. It does not contain estrogen hormone. Your health care provider inserts the tube in the inner part of the upper arm. The tube can remain in place for up to 3 years. After 3 years, the implant must be removed. The implant prevents the ovaries from releasing an egg (ovulation), thickens the cervical mucus to prevent sperm from entering the uterus, and thins the lining of the inside of the uterus.  Progesterone-only injections. These injections are given every 3 months by your health care provider to prevent pregnancy. This synthetic progesterone hormone stops the ovaries from releasing eggs. It also thickens cervical mucus and changes the uterine lining. This makes it harder for sperm to survive in the uterus.  Birth control pills. These pills contain estrogen and progesterone hormone. They work by preventing the ovaries from releasing eggs (ovulation). They also cause the cervical mucus to thicken, preventing the sperm from entering the uterus. Birth control pills are prescribed by a health care provider.Birth control pills can also be used to treat heavy periods.  Minipill. This type of birth control pill contains only the progesterone hormone. They are taken every day of each month and must be prescribed by your health care provider.  Birth control patch. The patch contains hormones similar to those in birth control pills. It must be changed once a week and is prescribed by a health care provider.  Vaginal ring. The ring contains hormones similar to those in birth control pills. It is left in the vagina for 3 weeks, removed for 1 week, and then a new one is put back in place. The patient must be comfortable inserting and removing the ring  from the vagina.A health care provider's prescription is necessary.  Emergency contraception. Emergency contraceptives prevent pregnancy after unprotected sexual intercourse. This pill can be taken right after sex or up to 5 days after unprotected sex. It is most effective the sooner you take the pills after having sexual intercourse. Most emergency contraceptive pills are available without a prescription. Check with your pharmacist. Do not use emergency contraception as your only form of birth control. BARRIER METHODS   Female condom. This is a thin sheath (latex or rubber) that is worn over the penis during sexual intercourse. It can be used with spermicide to increase effectiveness.  Female condom. This is a soft, loose-fitting sheath that is put into the vagina before sexual intercourse.  Diaphragm. This is a soft, latex, dome-shaped barrier that must be fitted by a health care provider. It is inserted into the vagina, along with a spermicidal jelly. It is inserted before intercourse. The diaphragm should be left in the vagina for 6 to 8 hours after intercourse.  Cervical cap. This is a round, soft, latex or plastic cup that fits over the cervix and must be fitted by a health care provider. The cap can be left in place for up to 48 hours after intercourse.  Sponge. This is a soft, circular piece of polyurethane foam. The sponge has spermicide in it. It is inserted into the vagina after wetting it and before sexual intercourse.  Spermicides. These are chemicals that kill or block sperm from entering the cervix and uterus. They come in the form of creams, jellies, suppositories, foam, or tablets. They do not require a   prescription. They are inserted into the vagina with an applicator before having sexual intercourse. The process must be repeated every time you have sexual intercourse. INTRAUTERINE CONTRACEPTION  Intrauterine device (IUD). This is a T-shaped device that is put in a woman's uterus  during a menstrual period to prevent pregnancy. There are 2 types:  Copper IUD. This type of IUD is wrapped in copper wire and is placed inside the uterus. Copper makes the uterus and fallopian tubes produce a fluid that kills sperm. It can stay in place for 10 years.  Hormone IUD. This type of IUD contains the hormone progestin (synthetic progesterone). The hormone thickens the cervical mucus and prevents sperm from entering the uterus, and it also thins the uterine lining to prevent implantation of a fertilized egg. The hormone can weaken or kill the sperm that get into the uterus. It can stay in place for 3-5 years, depending on which type of IUD is used. PERMANENT METHODS OF CONTRACEPTION  Female tubal ligation. This is when the woman's fallopian tubes are surgically sealed, tied, or blocked to prevent the egg from traveling to the uterus.  Hysteroscopic sterilization. This involves placing a small coil or insert into each fallopian tube. Your doctor uses a technique called hysteroscopy to do the procedure. The device causes scar tissue to form. This results in permanent blockage of the fallopian tubes, so the sperm cannot fertilize the egg. It takes about 3 months after the procedure for the tubes to become blocked. You must use another form of birth control for these 3 months.  Female sterilization. This is when the female has the tubes that carry sperm tied off (vasectomy).This blocks sperm from entering the vagina during sexual intercourse. After the procedure, the man can still ejaculate fluid (semen). NATURAL PLANNING METHODS  Natural family planning. This is not having sexual intercourse or using a barrier method (condom, diaphragm, cervical cap) on days the woman could become pregnant.  Calendar method. This is keeping track of the length of each menstrual cycle and identifying when you are fertile.  Ovulation method. This is avoiding sexual intercourse during ovulation.  Symptothermal  method. This is avoiding sexual intercourse during ovulation, using a thermometer and ovulation symptoms.  Post-ovulation method. This is timing sexual intercourse after you have ovulated. Regardless of which type or method of contraception you choose, it is important that you use condoms to protect against the transmission of sexually transmitted infections (STIs). Talk with your health care provider about which form of contraception is most appropriate for you. Document Released: 09/05/2005 Document Revised: 09/10/2013 Document Reviewed: 02/28/2013 [employer] Patient Information 2015 [employer]. This information is not intended to replace advice given to you by your health care provider. Make sure you discuss any questions you have with your health care provider.  

## 2015-11-10 ENCOUNTER — Encounter: Payer: Self-pay | Admitting: *Deleted
